# Patient Record
Sex: Female | Born: 1945 | Race: White | Hispanic: No | Marital: Single | Smoking: Never smoker
Health system: Southern US, Community
[De-identification: ages and names within clinical notes are randomized; demographics above are authoritative.]

## PROBLEM LIST (undated history)

## (undated) DIAGNOSIS — D649 Anemia, unspecified: Secondary | ICD-10-CM

## (undated) DIAGNOSIS — G43909 Migraine, unspecified, not intractable, without status migrainosus: Secondary | ICD-10-CM

## (undated) DIAGNOSIS — I10 Essential (primary) hypertension: Secondary | ICD-10-CM

## (undated) DIAGNOSIS — N179 Acute kidney failure, unspecified: Secondary | ICD-10-CM

## (undated) DIAGNOSIS — D472 Monoclonal gammopathy: Secondary | ICD-10-CM

## (undated) DIAGNOSIS — E785 Hyperlipidemia, unspecified: Secondary | ICD-10-CM

## (undated) DIAGNOSIS — J45909 Unspecified asthma, uncomplicated: Secondary | ICD-10-CM

## (undated) HISTORY — DX: Hyperlipidemia, unspecified: E78.5

## (undated) HISTORY — DX: Anemia, unspecified: D64.9

## (undated) HISTORY — DX: Monoclonal gammopathy: D47.2

## (undated) HISTORY — DX: Migraine, unspecified, not intractable, without status migrainosus: G43.909

## (undated) HISTORY — DX: Essential (primary) hypertension: I10

## (undated) HISTORY — PX: FOOT SURGERY: SHX648

---

## 2014-03-25 ENCOUNTER — Other Ambulatory Visit: Payer: Self-pay | Admitting: Family Medicine

## 2014-03-25 DIAGNOSIS — Z1231 Encounter for screening mammogram for malignant neoplasm of breast: Secondary | ICD-10-CM

## 2014-04-11 ENCOUNTER — Ambulatory Visit
Admission: RE | Admit: 2014-04-11 | Discharge: 2014-04-11 | Disposition: A | Discharge status: Home / Self Care | Payer: 59 | Source: Ambulatory Visit | Attending: Family Medicine | Admitting: Family Medicine

## 2014-04-11 DIAGNOSIS — Z1231 Encounter for screening mammogram for malignant neoplasm of breast: Secondary | ICD-10-CM

## 2014-11-22 ENCOUNTER — Ambulatory Visit (INDEPENDENT_AMBULATORY_CARE_PROVIDER_SITE_OTHER): Payer: 59

## 2014-11-22 ENCOUNTER — Encounter: Payer: Self-pay | Admitting: Podiatry

## 2014-11-22 ENCOUNTER — Ambulatory Visit (INDEPENDENT_AMBULATORY_CARE_PROVIDER_SITE_OTHER): Payer: 59 | Admitting: Podiatry

## 2014-11-22 VITALS — BP 120/66 | HR 75 | Resp 16 | Ht 68.0 in | Wt 170.0 lb

## 2014-11-22 DIAGNOSIS — M722 Plantar fascial fibromatosis: Secondary | ICD-10-CM

## 2014-11-22 NOTE — Patient Instructions (Signed)

## 2014-11-22 NOTE — Progress Notes (Signed)
   Subjective:    Patient ID: Rebecca Chen, female    DOB: August 12, 1946, 69 y.o.   MRN: [id number]  HPI Comments: Bilateral heel pain , sore heels. The real pain started about two weeks ago. I thought it was my orthotics. Ordered Rebecca Chen , new to [place] [employer] area. Right more so than the left .   Foot Pain      Review of Systems  All other systems reviewed and are negative.      Objective:   Physical Exam: I have reviewed her past medical history medications allergies surgery social history and review of systems. Pulses are strongly palpable bilateral. Neurologic sensorium is intact per Semmes-Weinstein monofilament. Deep tendon reflexes are intact bilateral strength +5 over 5 dorsiflexion plantar flexors and inverters everters onto the musculature is intact. Orthopedic evaluation of his roots all joints distal to the ankle for range of motion without crepitation. She has pain on palpation medial calcaneal tubercles bilateral right greater than left. Radiographic evaluation demonstrates soft tissue increase in density at the plantar fascial calcaneal insertion sites right greater than left. I evaluated her orthotics today which appear to be in good condition. Cutaneous evaluation demonstrates supple well-hydrated cutis no erythema edema cellulitis drainage or odor.        Assessment & Plan:  Assessment: Plantar fasciitis bilateral. Right greater than left.  Plan: Continue the use of the orthotics and see about purchasing a new pair of tennis shoes. We discussed appropriate shoe gear stretching exercises ice therapy issue or modifications. I injected her right heel today with Kenalog and local anesthetic. 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

## 2014-12-20 ENCOUNTER — Ambulatory Visit (INDEPENDENT_AMBULATORY_CARE_PROVIDER_SITE_OTHER): Payer: 59 | Admitting: Podiatry

## 2014-12-20 ENCOUNTER — Encounter: Payer: Self-pay | Admitting: Podiatry

## 2014-12-20 DIAGNOSIS — M722 Plantar fascial fibromatosis: Secondary | ICD-10-CM

## 2014-12-20 NOTE — Progress Notes (Signed)
She presents today for follow-up of her plantar fasciitis. She states that is slowly getting better after having been sore after the injection.  Objective: Vital signs are stable she is alert and oriented 3. Pulses are palpable right. Minimal pain on palpation medial calcaneal tubercle of the right heel.  Assessment: Resolving plantar fasciitis right.  Plan: Encouraged conservative therapies to be continued. We also dispensed a plantar fascial night brace.

## 2015-01-09 ENCOUNTER — Other Ambulatory Visit: Payer: Self-pay | Admitting: Family Medicine

## 2015-01-09 DIAGNOSIS — E2839 Other primary ovarian failure: Secondary | ICD-10-CM

## 2015-01-16 ENCOUNTER — Ambulatory Visit
Admission: RE | Admit: 2015-01-16 | Discharge: 2015-01-16 | Disposition: A | Discharge status: Home / Self Care | Payer: 59 | Source: Ambulatory Visit | Attending: Family Medicine | Admitting: Family Medicine

## 2015-01-16 DIAGNOSIS — E2839 Other primary ovarian failure: Secondary | ICD-10-CM

## 2015-01-26 ENCOUNTER — Ambulatory Visit: Payer: 59 | Admitting: Podiatry

## 2015-11-23 DIAGNOSIS — H43812 Vitreous degeneration, left eye: Secondary | ICD-10-CM | POA: Diagnosis not present

## 2015-12-06 MED FILL — AZITHROMYCIN 250 MG TABLET: 250 | 5 days supply | Qty: 6 | Fill #0

## 2015-12-25 MED FILL — SIMVASTATIN 40 MG TABLET: 40 | 90 days supply | Qty: 90 | Fill #1

## 2015-12-25 MED FILL — ATENOLOL 50 MG TABLET: 50 | 90 days supply | Qty: 90 | Fill #1

## 2016-01-04 DIAGNOSIS — Z85828 Personal history of other malignant neoplasm of skin: Secondary | ICD-10-CM | POA: Diagnosis not present

## 2016-01-04 DIAGNOSIS — L578 Other skin changes due to chronic exposure to nonionizing radiation: Secondary | ICD-10-CM | POA: Diagnosis not present

## 2016-01-04 DIAGNOSIS — L570 Actinic keratosis: Secondary | ICD-10-CM | POA: Diagnosis not present

## 2016-01-04 DIAGNOSIS — L814 Other melanin hyperpigmentation: Secondary | ICD-10-CM | POA: Diagnosis not present

## 2016-01-04 DIAGNOSIS — L821 Other seborrheic keratosis: Secondary | ICD-10-CM | POA: Diagnosis not present

## 2016-01-04 DIAGNOSIS — D225 Melanocytic nevi of trunk: Secondary | ICD-10-CM | POA: Diagnosis not present

## 2016-02-12 DIAGNOSIS — H52222 Regular astigmatism, left eye: Secondary | ICD-10-CM | POA: Diagnosis not present

## 2016-02-12 DIAGNOSIS — H524 Presbyopia: Secondary | ICD-10-CM | POA: Diagnosis not present

## 2016-02-12 DIAGNOSIS — H5203 Hypermetropia, bilateral: Secondary | ICD-10-CM | POA: Diagnosis not present

## 2016-03-19 MED FILL — SIMVASTATIN 40 MG TABLET: 40 | 90 days supply | Qty: 90 | Fill #0

## 2016-03-19 MED FILL — ATENOLOL 50 MG TABLET: 50 | 90 days supply | Qty: 90 | Fill #0

## 2016-04-22 DIAGNOSIS — E785 Hyperlipidemia, unspecified: Secondary | ICD-10-CM | POA: Diagnosis not present

## 2016-04-22 DIAGNOSIS — I10 Essential (primary) hypertension: Secondary | ICD-10-CM | POA: Diagnosis not present

## 2016-06-17 MED FILL — SIMVASTATIN 40 MG TABLET: 40 | 90 days supply | Qty: 90 | Fill #0

## 2016-06-17 MED FILL — ATENOLOL 50 MG TABLET: 50 | 30 days supply | Qty: 30 | Fill #0

## 2016-07-15 MED FILL — ATENOLOL 50 MG TABLET: 50 | 90 days supply | Qty: 90 | Fill #1

## 2016-07-16 DIAGNOSIS — M9901 Segmental and somatic dysfunction of cervical region: Secondary | ICD-10-CM | POA: Diagnosis not present

## 2016-07-16 DIAGNOSIS — M62838 Other muscle spasm: Secondary | ICD-10-CM | POA: Diagnosis not present

## 2016-07-16 DIAGNOSIS — M25512 Pain in left shoulder: Secondary | ICD-10-CM | POA: Diagnosis not present

## 2016-07-16 DIAGNOSIS — M9902 Segmental and somatic dysfunction of thoracic region: Secondary | ICD-10-CM | POA: Diagnosis not present

## 2016-07-19 DIAGNOSIS — M9901 Segmental and somatic dysfunction of cervical region: Secondary | ICD-10-CM | POA: Diagnosis not present

## 2016-07-19 DIAGNOSIS — M9902 Segmental and somatic dysfunction of thoracic region: Secondary | ICD-10-CM | POA: Diagnosis not present

## 2016-07-19 DIAGNOSIS — M25512 Pain in left shoulder: Secondary | ICD-10-CM | POA: Diagnosis not present

## 2016-07-19 DIAGNOSIS — M62838 Other muscle spasm: Secondary | ICD-10-CM | POA: Diagnosis not present

## 2016-07-24 DIAGNOSIS — M9901 Segmental and somatic dysfunction of cervical region: Secondary | ICD-10-CM | POA: Diagnosis not present

## 2016-07-24 DIAGNOSIS — M9902 Segmental and somatic dysfunction of thoracic region: Secondary | ICD-10-CM | POA: Diagnosis not present

## 2016-07-24 DIAGNOSIS — M25512 Pain in left shoulder: Secondary | ICD-10-CM | POA: Diagnosis not present

## 2016-07-24 DIAGNOSIS — M62838 Other muscle spasm: Secondary | ICD-10-CM | POA: Diagnosis not present

## 2016-07-29 DIAGNOSIS — M9902 Segmental and somatic dysfunction of thoracic region: Secondary | ICD-10-CM | POA: Diagnosis not present

## 2016-07-29 DIAGNOSIS — M9901 Segmental and somatic dysfunction of cervical region: Secondary | ICD-10-CM | POA: Diagnosis not present

## 2016-07-29 DIAGNOSIS — M62838 Other muscle spasm: Secondary | ICD-10-CM | POA: Diagnosis not present

## 2016-07-29 DIAGNOSIS — M25512 Pain in left shoulder: Secondary | ICD-10-CM | POA: Diagnosis not present

## 2016-08-01 DIAGNOSIS — M9902 Segmental and somatic dysfunction of thoracic region: Secondary | ICD-10-CM | POA: Diagnosis not present

## 2016-08-01 DIAGNOSIS — M25512 Pain in left shoulder: Secondary | ICD-10-CM | POA: Diagnosis not present

## 2016-08-01 DIAGNOSIS — M62838 Other muscle spasm: Secondary | ICD-10-CM | POA: Diagnosis not present

## 2016-08-01 DIAGNOSIS — M9901 Segmental and somatic dysfunction of cervical region: Secondary | ICD-10-CM | POA: Diagnosis not present

## 2016-08-07 DIAGNOSIS — M9902 Segmental and somatic dysfunction of thoracic region: Secondary | ICD-10-CM | POA: Diagnosis not present

## 2016-08-07 DIAGNOSIS — M62838 Other muscle spasm: Secondary | ICD-10-CM | POA: Diagnosis not present

## 2016-08-07 DIAGNOSIS — M9901 Segmental and somatic dysfunction of cervical region: Secondary | ICD-10-CM | POA: Diagnosis not present

## 2016-08-07 DIAGNOSIS — M25512 Pain in left shoulder: Secondary | ICD-10-CM | POA: Diagnosis not present

## 2016-09-09 MED FILL — SIMVASTATIN 40 MG TABLET: 40 | 90 days supply | Qty: 90 | Fill #1

## 2016-10-14 MED FILL — ATENOLOL 50 MG TABLET: 50 | 30 days supply | Qty: 30 | Fill #2

## 2016-11-18 MED FILL — ATENOLOL 50 MG TABLET: 50 | 30 days supply | Qty: 30 | Fill #3

## 2016-12-20 MED FILL — SIMVASTATIN 40 MG TABLET: 40 | 90 days supply | Qty: 90 | Fill #2

## 2016-12-20 MED FILL — ATENOLOL 50 MG TABLET: 50 | 30 days supply | Qty: 30 | Fill #4

## 2016-12-24 MED FILL — AZITHROMYCIN 250 MG TABLET: 250 | 5 days supply | Qty: 6 | Fill #0

## 2016-12-24 MED FILL — CIPROFLOXACIN HCL 500 MG TA: 500 | 3 days supply | Qty: 6 | Fill #0

## 2017-01-20 MED FILL — ATENOLOL 50 MG TABLET: 50 | 30 days supply | Qty: 30 | Fill #5

## 2017-02-17 MED FILL — ATENOLOL 50 MG TABLET: 50 | 30 days supply | Qty: 30 | Fill #6

## 2017-03-17 MED FILL — ATENOLOL 50 MG TABLET: 50 | 30 days supply | Qty: 30 | Fill #7

## 2017-04-04 DIAGNOSIS — E559 Vitamin D deficiency, unspecified: Secondary | ICD-10-CM | POA: Diagnosis not present

## 2017-04-04 DIAGNOSIS — Z0000 Encounter for general adult medical examination without abnormal findings: Secondary | ICD-10-CM | POA: Diagnosis not present

## 2017-04-04 DIAGNOSIS — I10 Essential (primary) hypertension: Secondary | ICD-10-CM | POA: Diagnosis not present

## 2017-04-04 DIAGNOSIS — E785 Hyperlipidemia, unspecified: Secondary | ICD-10-CM | POA: Diagnosis not present

## 2017-04-04 DIAGNOSIS — Z5181 Encounter for therapeutic drug level monitoring: Secondary | ICD-10-CM | POA: Diagnosis not present

## 2017-04-04 MED FILL — SIMVASTATIN 40 MG TABLET: 40 | 90 days supply | Qty: 90 | Fill #0

## 2017-04-04 MED FILL — EPIPEN 2-PAK 0.3 MG AUTO-IN: 0.3 | 30 days supply | Qty: 2 | Fill #0

## 2017-04-10 ENCOUNTER — Other Ambulatory Visit: Payer: Self-pay | Admitting: Family Medicine

## 2017-04-10 DIAGNOSIS — Z1231 Encounter for screening mammogram for malignant neoplasm of breast: Secondary | ICD-10-CM

## 2017-04-14 MED FILL — ATENOLOL 50 MG TABLET: 50 | 30 days supply | Qty: 30 | Fill #8

## 2017-04-21 DIAGNOSIS — D1801 Hemangioma of skin and subcutaneous tissue: Secondary | ICD-10-CM | POA: Diagnosis not present

## 2017-04-21 DIAGNOSIS — L814 Other melanin hyperpigmentation: Secondary | ICD-10-CM | POA: Diagnosis not present

## 2017-04-21 DIAGNOSIS — L570 Actinic keratosis: Secondary | ICD-10-CM | POA: Diagnosis not present

## 2017-04-21 DIAGNOSIS — L821 Other seborrheic keratosis: Secondary | ICD-10-CM | POA: Diagnosis not present

## 2017-04-21 DIAGNOSIS — L918 Other hypertrophic disorders of the skin: Secondary | ICD-10-CM | POA: Diagnosis not present

## 2017-04-21 DIAGNOSIS — D225 Melanocytic nevi of trunk: Secondary | ICD-10-CM | POA: Diagnosis not present

## 2017-04-25 ENCOUNTER — Ambulatory Visit
Admission: RE | Admit: 2017-04-25 | Discharge: 2017-04-25 | Disposition: A | Discharge status: Home / Self Care | Payer: 59 | Source: Ambulatory Visit | Attending: Family Medicine | Admitting: Family Medicine

## 2017-04-25 DIAGNOSIS — Z1231 Encounter for screening mammogram for malignant neoplasm of breast: Secondary | ICD-10-CM | POA: Diagnosis not present

## 2017-04-30 DIAGNOSIS — H5203 Hypermetropia, bilateral: Secondary | ICD-10-CM | POA: Diagnosis not present

## 2017-04-30 DIAGNOSIS — H524 Presbyopia: Secondary | ICD-10-CM | POA: Diagnosis not present

## 2017-04-30 DIAGNOSIS — H52222 Regular astigmatism, left eye: Secondary | ICD-10-CM | POA: Diagnosis not present

## 2017-05-19 MED FILL — ATENOLOL 50 MG TABLET: 50 | 90 days supply | Qty: 90 | Fill #0

## 2017-05-30 MED FILL — AZITHROMYCIN 250 MG TABLET: 250 | 5 days supply | Qty: 6 | Fill #0

## 2017-05-30 MED FILL — VIVOTIF EC CAPSULE: 8 days supply | Qty: 4 | Fill #0

## 2017-08-18 MED FILL — ATENOLOL 50 MG TABLET: 50 | 90 days supply | Qty: 90 | Fill #1

## 2017-08-18 MED FILL — SIMVASTATIN 40 MG TABLET: 40 | 90 days supply | Qty: 90 | Fill #1

## 2017-10-02 DIAGNOSIS — M150 Primary generalized (osteo)arthritis: Secondary | ICD-10-CM | POA: Diagnosis not present

## 2017-10-02 DIAGNOSIS — E663 Overweight: Secondary | ICD-10-CM | POA: Diagnosis not present

## 2017-10-02 DIAGNOSIS — Z6827 Body mass index (BMI) 27.0-27.9, adult: Secondary | ICD-10-CM | POA: Diagnosis not present

## 2017-10-02 DIAGNOSIS — L84 Corns and callosities: Secondary | ICD-10-CM | POA: Diagnosis not present

## 2017-10-02 DIAGNOSIS — M2550 Pain in unspecified joint: Secondary | ICD-10-CM | POA: Diagnosis not present

## 2017-11-17 MED FILL — ATENOLOL 50 MG TABLET: 50 | 90 days supply | Qty: 90 | Fill #2

## 2017-11-17 MED FILL — SIMVASTATIN 40 MG TABLET: 40 | 90 days supply | Qty: 90 | Fill #2

## 2018-02-16 MED FILL — SIMVASTATIN 40 MG TABLET: 40 | 90 days supply | Qty: 90 | Fill #3

## 2018-02-16 MED FILL — ATENOLOL 50 MG TABLET: 50 | 90 days supply | Qty: 90 | Fill #3

## 2018-04-29 DIAGNOSIS — L821 Other seborrheic keratosis: Secondary | ICD-10-CM | POA: Diagnosis not present

## 2018-04-29 DIAGNOSIS — D1801 Hemangioma of skin and subcutaneous tissue: Secondary | ICD-10-CM | POA: Diagnosis not present

## 2018-04-29 DIAGNOSIS — L814 Other melanin hyperpigmentation: Secondary | ICD-10-CM | POA: Diagnosis not present

## 2018-04-29 DIAGNOSIS — D225 Melanocytic nevi of trunk: Secondary | ICD-10-CM | POA: Diagnosis not present

## 2018-05-18 MED FILL — SIMVASTATIN 40 MG TABLET: 40 | 90 days supply | Qty: 90 | Fill #0

## 2018-05-18 MED FILL — ATENOLOL 50 MG TABLET: 50 | 90 days supply | Qty: 90 | Fill #0

## 2018-05-29 DIAGNOSIS — Z0000 Encounter for general adult medical examination without abnormal findings: Secondary | ICD-10-CM | POA: Diagnosis not present

## 2018-05-29 DIAGNOSIS — Z5181 Encounter for therapeutic drug level monitoring: Secondary | ICD-10-CM | POA: Diagnosis not present

## 2018-05-29 DIAGNOSIS — E559 Vitamin D deficiency, unspecified: Secondary | ICD-10-CM | POA: Diagnosis not present

## 2018-05-29 DIAGNOSIS — R05 Cough: Secondary | ICD-10-CM | POA: Diagnosis not present

## 2018-05-29 DIAGNOSIS — E785 Hyperlipidemia, unspecified: Secondary | ICD-10-CM | POA: Diagnosis not present

## 2018-05-29 DIAGNOSIS — I10 Essential (primary) hypertension: Secondary | ICD-10-CM | POA: Diagnosis not present

## 2018-06-02 ENCOUNTER — Other Ambulatory Visit: Payer: Self-pay | Admitting: Family Medicine

## 2018-06-02 DIAGNOSIS — Z1231 Encounter for screening mammogram for malignant neoplasm of breast: Secondary | ICD-10-CM

## 2018-06-02 MED FILL — AZITHROMYCIN 250 MG TABLET: 250 | 5 days supply | Qty: 6 | Fill #0

## 2018-06-02 MED FILL — PROMETHAZINE W/COD SYRUP: 6.25-10 | 5 days supply | Qty: 100 | Fill #0

## 2018-06-29 ENCOUNTER — Ambulatory Visit
Admission: RE | Admit: 2018-06-29 | Discharge: 2018-06-29 | Disposition: A | Discharge status: Home / Self Care | Payer: 59 | Source: Ambulatory Visit | Attending: Family Medicine | Admitting: Family Medicine

## 2018-06-29 DIAGNOSIS — Z1231 Encounter for screening mammogram for malignant neoplasm of breast: Secondary | ICD-10-CM

## 2018-07-14 MED FILL — PEG-3350 SOLUTION: 420 | 1 days supply | Qty: 4000 | Fill #0

## 2018-07-15 DIAGNOSIS — L570 Actinic keratosis: Secondary | ICD-10-CM | POA: Diagnosis not present

## 2018-07-15 DIAGNOSIS — L720 Epidermal cyst: Secondary | ICD-10-CM | POA: Diagnosis not present

## 2018-08-10 MED FILL — ATENOLOL 50 MG TABLET: 50 | 90 days supply | Qty: 90 | Fill #0

## 2018-08-14 MED FILL — SIMVASTATIN 40 MG TABLET: 40 | 90 days supply | Qty: 90 | Fill #0

## 2018-08-26 DIAGNOSIS — K649 Unspecified hemorrhoids: Secondary | ICD-10-CM | POA: Diagnosis not present

## 2018-08-26 DIAGNOSIS — Z86010 Personal history of colonic polyps: Secondary | ICD-10-CM | POA: Diagnosis not present

## 2018-08-26 DIAGNOSIS — K5730 Diverticulosis of large intestine without perforation or abscess without bleeding: Secondary | ICD-10-CM | POA: Diagnosis not present

## 2018-09-14 MED FILL — SHINGRIX 50 MCG SUS: 50 | 1 days supply | Qty: 1 | Fill #1

## 2018-10-02 ENCOUNTER — Emergency Department (HOSPITAL_COMMUNITY): Payer: Medicare Other

## 2018-10-02 ENCOUNTER — Encounter (HOSPITAL_COMMUNITY): Payer: Self-pay | Admitting: *Deleted

## 2018-10-02 ENCOUNTER — Emergency Department (HOSPITAL_COMMUNITY)
Admission: EM | Admit: 2018-10-02 | Discharge: 2018-10-02 | Disposition: A | Discharge status: Home / Self Care | Payer: Medicare Other | Attending: Emergency Medicine | Admitting: Emergency Medicine

## 2018-10-02 DIAGNOSIS — I10 Essential (primary) hypertension: Secondary | ICD-10-CM | POA: Insufficient documentation

## 2018-10-02 DIAGNOSIS — R0789 Other chest pain: Secondary | ICD-10-CM | POA: Insufficient documentation

## 2018-10-02 DIAGNOSIS — R05 Cough: Secondary | ICD-10-CM | POA: Diagnosis not present

## 2018-10-02 DIAGNOSIS — R079 Chest pain, unspecified: Secondary | ICD-10-CM | POA: Diagnosis not present

## 2018-10-02 DIAGNOSIS — Z79899 Other long term (current) drug therapy: Secondary | ICD-10-CM | POA: Insufficient documentation

## 2018-10-02 DIAGNOSIS — R0602 Shortness of breath: Secondary | ICD-10-CM | POA: Diagnosis not present

## 2018-10-02 LAB — BASIC METABOLIC PANEL
Anion gap: 7 (ref 5–15)
BUN: 15 mg/dL (ref 8–23)
CALCIUM: 9.4 mg/dL (ref 8.9–10.3)
CO2: 25 mmol/L (ref 22–32)
CREATININE: 1.31 mg/dL — AB (ref 0.44–1.00)
Chloride: 107 mmol/L (ref 98–111)
GFR calc Af Amer: 48 mL/min — ABNORMAL LOW (ref >60)
GFR, EST NON AFRICAN AMERICAN: 41 mL/min — AB (ref >60)
Glucose, Bld: 102 mg/dL — ABNORMAL HIGH (ref 70–99)
Potassium: 4.4 mmol/L (ref 3.5–5.1)
SODIUM: 139 mmol/L (ref 135–145)

## 2018-10-02 LAB — I-STAT TROPONIN, ED
TROPONIN I, POC: 0 ng/mL (ref 0.00–0.08)
Troponin i, poc: 0.01 ng/mL (ref 0.00–0.08)

## 2018-10-02 LAB — CBC
HCT: 33.6 % — ABNORMAL LOW (ref 36.0–46.0)
Hemoglobin: 10.4 g/dL — ABNORMAL LOW (ref 12.0–15.0)
MCH: 27.9 pg (ref 26.0–34.0)
MCHC: 31 g/dL (ref 30.0–36.0)
MCV: 90.1 fL (ref 80.0–100.0)
PLATELETS: 358 10*3/uL (ref 150–400)
RBC: 3.73 MIL/uL — ABNORMAL LOW (ref 3.87–5.11)
RDW: 14.7 % (ref 11.5–15.5)
WBC: 7.4 10*3/uL (ref 4.0–10.5)
nRBC: 0 % (ref 0.0–0.2)

## 2018-10-02 MED ORDER — ALBUTEROL SULFATE HFA 108 (90 BASE) MCG/ACT IN AERS
2.0000 | INHALATION_SPRAY | Freq: Once | RESPIRATORY_TRACT | Status: AC
  Administered 2018-10-02: 2 via RESPIRATORY_TRACT
  Filled 2018-10-02: qty 6.7

## 2018-10-02 NOTE — Discharge Instructions (Signed)
If you develop severe or constant chest pain or pressure, worsening shortness of breath, fever, vomiting, or any other new/concerning symptoms then return to the ER for evaluation.

## 2018-10-02 NOTE — ED Triage Notes (Signed)
Pt in c/o chest pain, shortness of breath with exertion, dry cough for the last few days, worse today

## 2018-10-02 NOTE — ED Notes (Signed)
Called for updating vitals no answer x3

## 2018-10-02 NOTE — ED Provider Notes (Signed)
[facility] EMERGENCY DEPARTMENT Provider Note   CSN: [id number] Arrival date & time: 10/02/18  1153     History   Chief Complaint Chief Complaint  Patient presents with  . Chest Pain    HPI Rebecca Chen is a 73 y.o. female.  HPI  73 year old female presents with chest pressure and dyspnea.  Has been ongoing for the past 2 days.  Shortness of breath is most noticeable when she walks or exerts herself.  The chest pressure comes and goes randomly and lasts less than a minute at a time.  She has been having a cough, most noticeable over the last few days as well but also a little bit before then.  No fevers or productive cough.  Chronic lower extremity swelling but this is unchanged from normal.  She does not have any of the chest pressure or pain right now.  She has felt like she has had an audible expiratory wheeze from time to time.  No smoking history.  Past Medical History:  Diagnosis Date  . Hypertension     There are no active problems to display for this patient.   Past Surgical History:  Procedure Laterality Date  . FOOT SURGERY       OB History   No obstetric history on file.      Home Medications    Prior to Admission medications   Medication Sig Start Date End Date Taking? Authorizing Provider  atenolol (TENORMIN) 50 MG tablet Take 50 mg by mouth daily.    [provider]  simvastatin (ZOCOR) 40 MG tablet Take 40 mg by mouth daily.    [provider]    Family History History reviewed. No pertinent family history.  Social History Social History   Tobacco Use  . Smoking status: Never Smoker  Substance Use Topics  . Alcohol use: Not on file  . Drug use: Not on file     Allergies   Patient has no known allergies.   Review of Systems Review of Systems  Constitutional: Negative for fever.  Respiratory: Positive for cough and shortness of breath.   Cardiovascular: Positive for chest pain and leg swelling  (chronic ankle swelling).  All other systems reviewed and are negative.    Physical Exam Updated Vital Signs BP 137/80 (BP Location: Right Arm)   Pulse 70   Temp 98.1 F (36.7 C) (Oral)   Resp 16   SpO2 98%   Physical Exam Vitals signs and nursing note reviewed.  Constitutional:      General: She is not in acute distress.    Appearance: She is well-developed. She is not ill-appearing or diaphoretic.  HENT:     Head: Normocephalic and atraumatic.     Right Ear: External ear normal.     Left Ear: External ear normal.     Nose: Nose normal.  Eyes:     General:        Right eye: No discharge.        Left eye: No discharge.  Cardiovascular:     Rate and Rhythm: Normal rate and regular rhythm.     Heart sounds: Normal heart sounds.  Pulmonary:     Effort: Pulmonary effort is normal.     Breath sounds: Normal breath sounds. No decreased breath sounds, wheezing, rhonchi or rales.  Abdominal:     Palpations: Abdomen is soft.     Tenderness: There is no abdominal tenderness.  Skin:    General: Skin  is warm and dry.  Neurological:     Mental Status: She is alert.  Psychiatric:        Mood and Affect: Mood is not anxious.      ED Treatments / Results  Labs (all labs ordered are listed, but only abnormal results are displayed) Labs Reviewed  BASIC METABOLIC PANEL - Abnormal; Notable for the following components:      Result Value   Glucose, Bld 102 (*)    Creatinine, Ser 1.31 (*)    GFR calc non Af Amer 41 (*)    GFR calc Af Amer 48 (*)    All other components within normal limits  CBC - Abnormal; Notable for the following components:   RBC 3.73 (*)    Hemoglobin 10.4 (*)    HCT 33.6 (*)    All other components within normal limits  I-STAT TROPONIN, ED  I-STAT TROPONIN, ED    EKG EKG Interpretation  Date/Time:  Friday October 02 2018 12:16:57 EST Ventricular Rate:  66 PR Interval:  168 QRS Duration: 74 QT Interval:  394 QTC Calculation: 413 R  Axis:   7 Text Interpretation:  Normal sinus rhythm no acute ST/T changes No old tracing to compare Confirmed by Sherwood Gambler [phone]) on 10/02/2018 7:09:16 PM   EKG Interpretation  Date/Time:  Friday October 02 2018 19:45:02 EST Ventricular Rate:  71 PR Interval:  168 QRS Duration: 86 QT Interval:  396 QTC Calculation: 431 R Axis:   8 Text Interpretation:  Sinus rhythm Abnormal R-wave progression, early transition Baseline wander in lead(s) V2 no significant change since earlier in the day Confirmed by Sherwood Gambler [phone]) on 10/02/2018 8:00:39 PM       Radiology Dg Chest 2 View  Result Date: 10/02/2018 CLINICAL DATA:  Chest pain, shortness of breath EXAM: CHEST - 2 VIEW COMPARISON:  None. FINDINGS: The heart size and mediastinal contours are within normal limits. Both lungs are clear. The visualized skeletal structures are unremarkable. Trachea is midline. Aorta atherosclerotic. Subcentimeter right mid lung calcified granuloma. Degenerative changes noted spine. IMPRESSION: No active chest disease. Remote granulomatous disease. Aortic atherosclerosis Electronically Signed   By: Jerilynn Mages.  Shick M.D.   On: 10/02/2018 12:50    Procedures Procedures (including critical care time)  Medications Ordered in ED Medications  albuterol (PROVENTIL HFA;VENTOLIN HFA) 108 (90 Base) MCG/ACT inhaler 2 puff (has no administration in time range)     Initial Impression / Assessment and Plan / ED Course  I have reviewed the triage vital signs and the nursing notes.  Pertinent labs & imaging results that were available during my care of the patient were reviewed by me and considered in my medical decision making (see chart for details).     Patient's chest pain is atypical.  In the setting of cough/recent URI my suspicion of ACS, PE, or dissection is pretty low.  She is not having any chest pain currently.  She has no hypoxia, increased work of breathing, or tachycardia.  With no specific PE risk factors  in the setting I think this is pretty unlikely and do not think testing is needed.  She has troponins negative x2 and no acute ECG changes.  I think it is reasonable to try and treat her on and off wheezing with some albuterol as needed at home and otherwise follow-up with PCP.  We discussed that MI has been ruled out but not any coronary disease at all and she still needs to follow-up with PCP and  we also discussed strict return precautions.  Final Clinical Impressions(s) / ED Diagnoses   Final diagnoses:  Chest pressure    ED Discharge Orders    None       Sherwood Gambler, MD 10/02/18 2014

## 2018-10-02 NOTE — ED Notes (Signed)
Patient verbalizes understanding of discharge instructions. Opportunity for questioning and answers were provided. Armband removed by staff, pt discharged from ED.  

## 2018-10-05 DIAGNOSIS — J209 Acute bronchitis, unspecified: Secondary | ICD-10-CM | POA: Diagnosis not present

## 2018-12-15 DIAGNOSIS — G2581 Restless legs syndrome: Secondary | ICD-10-CM | POA: Diagnosis not present

## 2018-12-15 DIAGNOSIS — J209 Acute bronchitis, unspecified: Secondary | ICD-10-CM | POA: Diagnosis not present

## 2018-12-15 DIAGNOSIS — G43909 Migraine, unspecified, not intractable, without status migrainosus: Secondary | ICD-10-CM | POA: Diagnosis not present

## 2018-12-15 DIAGNOSIS — R079 Chest pain, unspecified: Secondary | ICD-10-CM | POA: Diagnosis not present

## 2018-12-23 DIAGNOSIS — R0789 Other chest pain: Secondary | ICD-10-CM | POA: Diagnosis not present

## 2018-12-23 DIAGNOSIS — J209 Acute bronchitis, unspecified: Secondary | ICD-10-CM | POA: Diagnosis not present

## 2018-12-24 ENCOUNTER — Telehealth: Payer: Self-pay

## 2018-12-25 ENCOUNTER — Ambulatory Visit: Payer: Self-pay | Admitting: Cardiology

## 2018-12-30 ENCOUNTER — Emergency Department (HOSPITAL_COMMUNITY): Payer: Medicare Other

## 2018-12-30 ENCOUNTER — Encounter (HOSPITAL_COMMUNITY): Payer: Self-pay

## 2018-12-30 ENCOUNTER — Other Ambulatory Visit: Payer: Self-pay

## 2018-12-30 ENCOUNTER — Inpatient Hospital Stay (HOSPITAL_COMMUNITY)
Admission: EM | Admit: 2018-12-30 | Discharge: 2019-01-01 | DRG: 253 | Disposition: A | Discharge status: Home / Self Care | Payer: Medicare Other | Attending: Family Medicine | Admitting: Family Medicine

## 2018-12-30 DIAGNOSIS — I82401 Acute embolism and thrombosis of unspecified deep veins of right lower extremity: Secondary | ICD-10-CM | POA: Diagnosis not present

## 2018-12-30 DIAGNOSIS — T380X5A Adverse effect of glucocorticoids and synthetic analogues, initial encounter: Secondary | ICD-10-CM | POA: Diagnosis present

## 2018-12-30 DIAGNOSIS — Z7952 Long term (current) use of systemic steroids: Secondary | ICD-10-CM

## 2018-12-30 DIAGNOSIS — R609 Edema, unspecified: Secondary | ICD-10-CM | POA: Diagnosis not present

## 2018-12-30 DIAGNOSIS — K5790 Diverticulosis of intestine, part unspecified, without perforation or abscess without bleeding: Secondary | ICD-10-CM | POA: Diagnosis present

## 2018-12-30 DIAGNOSIS — Z8711 Personal history of peptic ulcer disease: Secondary | ICD-10-CM | POA: Diagnosis not present

## 2018-12-30 DIAGNOSIS — I82441 Acute embolism and thrombosis of right tibial vein: Principal | ICD-10-CM | POA: Diagnosis present

## 2018-12-30 DIAGNOSIS — M7989 Other specified soft tissue disorders: Secondary | ICD-10-CM | POA: Diagnosis not present

## 2018-12-30 DIAGNOSIS — R0602 Shortness of breath: Secondary | ICD-10-CM | POA: Diagnosis not present

## 2018-12-30 DIAGNOSIS — R739 Hyperglycemia, unspecified: Secondary | ICD-10-CM | POA: Diagnosis present

## 2018-12-30 DIAGNOSIS — N179 Acute kidney failure, unspecified: Secondary | ICD-10-CM | POA: Diagnosis not present

## 2018-12-30 DIAGNOSIS — R0600 Dyspnea, unspecified: Secondary | ICD-10-CM

## 2018-12-30 DIAGNOSIS — N183 Chronic kidney disease, stage 3 (moderate): Secondary | ICD-10-CM | POA: Diagnosis present

## 2018-12-30 DIAGNOSIS — D649 Anemia, unspecified: Secondary | ICD-10-CM

## 2018-12-30 DIAGNOSIS — K649 Unspecified hemorrhoids: Secondary | ICD-10-CM | POA: Diagnosis present

## 2018-12-30 DIAGNOSIS — I129 Hypertensive chronic kidney disease with stage 1 through stage 4 chronic kidney disease, or unspecified chronic kidney disease: Secondary | ICD-10-CM | POA: Diagnosis present

## 2018-12-30 DIAGNOSIS — D72829 Elevated white blood cell count, unspecified: Secondary | ICD-10-CM | POA: Diagnosis present

## 2018-12-30 DIAGNOSIS — D509 Iron deficiency anemia, unspecified: Secondary | ICD-10-CM | POA: Diagnosis not present

## 2018-12-30 DIAGNOSIS — Z79899 Other long term (current) drug therapy: Secondary | ICD-10-CM

## 2018-12-30 DIAGNOSIS — I82409 Acute embolism and thrombosis of unspecified deep veins of unspecified lower extremity: Secondary | ICD-10-CM

## 2018-12-30 HISTORY — DX: Acute kidney failure, unspecified: N17.9

## 2018-12-30 LAB — CBC WITH DIFFERENTIAL/PLATELET
Abs Immature Granulocytes: 0.19 10*3/uL — ABNORMAL HIGH (ref 0.00–0.07)
Basophils Absolute: 0 10*3/uL (ref 0.0–0.1)
Basophils Relative: 0 %
Eosinophils Absolute: 0.1 10*3/uL (ref 0.0–0.5)
Eosinophils Relative: 0 %
HCT: 18.4 % — ABNORMAL LOW (ref 36.0–46.0)
Hemoglobin: 5.1 g/dL — CL (ref 12.0–15.0)
Immature Granulocytes: 1 %
Lymphocytes Relative: 8 %
Lymphs Abs: 1.8 10*3/uL (ref 0.7–4.0)
MCH: 22.9 pg — ABNORMAL LOW (ref 26.0–34.0)
MCHC: 27.7 g/dL — ABNORMAL LOW (ref 30.0–36.0)
MCV: 82.5 fL (ref 80.0–100.0)
Monocytes Absolute: 1 10*3/uL (ref 0.1–1.0)
Monocytes Relative: 5 %
Neutro Abs: 18.4 10*3/uL — ABNORMAL HIGH (ref 1.7–7.7)
Neutrophils Relative %: 86 %
Platelets: 378 10*3/uL (ref 150–400)
RBC: 2.23 MIL/uL — ABNORMAL LOW (ref 3.87–5.11)
RDW: 17 % — ABNORMAL HIGH (ref 11.5–15.5)
WBC: 21.5 10*3/uL — ABNORMAL HIGH (ref 4.0–10.5)
nRBC: 0.1 % (ref 0.0–0.2)

## 2018-12-30 LAB — RETICULOCYTES
Immature Retic Fract: 36.4 % — ABNORMAL HIGH (ref 2.3–15.9)
RBC.: 2.24 MIL/uL — ABNORMAL LOW (ref 3.87–5.11)
Retic Count, Absolute: 78 10*3/uL (ref 19.0–186.0)
Retic Ct Pct: 3.5 % — ABNORMAL HIGH (ref 0.4–3.1)

## 2018-12-30 LAB — BASIC METABOLIC PANEL
Anion gap: 13 (ref 5–15)
BUN: 22 mg/dL (ref 8–23)
CO2: 21 mmol/L — ABNORMAL LOW (ref 22–32)
Calcium: 8.6 mg/dL — ABNORMAL LOW (ref 8.9–10.3)
Chloride: 103 mmol/L (ref 98–111)
Creatinine, Ser: 1.47 mg/dL — ABNORMAL HIGH (ref 0.44–1.00)
GFR calc Af Amer: 41 mL/min — ABNORMAL LOW (ref >60)
GFR calc non Af Amer: 36 mL/min — ABNORMAL LOW (ref >60)
Glucose, Bld: 179 mg/dL — ABNORMAL HIGH (ref 70–99)
Potassium: 3.7 mmol/L (ref 3.5–5.1)
Sodium: 137 mmol/L (ref 135–145)

## 2018-12-30 LAB — FERRITIN: Ferritin: 4 ng/mL — ABNORMAL LOW (ref 11–307)

## 2018-12-30 LAB — IRON AND TIBC
Iron: 9 ug/dL — ABNORMAL LOW (ref 28–170)
Saturation Ratios: 2 % — ABNORMAL LOW (ref 10.4–31.8)
TIBC: 502 ug/dL — ABNORMAL HIGH (ref 250–450)
UIBC: 493 ug/dL

## 2018-12-30 LAB — SAVE SMEAR (SSMR)

## 2018-12-30 LAB — PREPARE RBC (CROSSMATCH)

## 2018-12-30 MED ORDER — ONDANSETRON HCL 4 MG/2ML IJ SOLN: 4.0000 mg | Freq: Four times a day (QID) | INTRAMUSCULAR | Status: DC | PRN

## 2018-12-30 MED ORDER — HEPARIN SODIUM (PORCINE) 5000 UNIT/ML IJ SOLN
5000.0000 [IU] | Freq: Three times a day (TID) | INTRAMUSCULAR | Status: DC
  Administered 2018-12-31 – 2019-01-01 (×3): 5000 [IU] via SUBCUTANEOUS
  Filled 2018-12-30 (×3): qty 1

## 2018-12-30 MED ORDER — VITAMIN C 500 MG PO TABS
500.0000 mg | ORAL_TABLET | Freq: Every day | ORAL | Status: DC
  Administered 2018-12-31: 500 mg via ORAL
  Filled 2018-12-30: qty 1

## 2018-12-30 MED ORDER — HEPARIN SODIUM (PORCINE) 5000 UNIT/ML IJ SOLN: 5000.0000 [IU] | Freq: Three times a day (TID) | INTRAMUSCULAR | Status: DC

## 2018-12-30 MED ORDER — CYANOCOBALAMIN 500 MCG PO TABS
250.0000 ug | ORAL_TABLET | Freq: Every day | ORAL | Status: DC
  Administered 2018-12-31: 250 ug via ORAL
  Filled 2018-12-30 (×2): qty 1

## 2018-12-30 MED ORDER — SIMVASTATIN 40 MG PO TABS
40.0000 mg | ORAL_TABLET | Freq: Every day | ORAL | Status: DC
  Administered 2018-12-31: 10:00:00 40 mg via ORAL
  Filled 2018-12-30: qty 1

## 2018-12-30 MED ORDER — SODIUM CHLORIDE 0.9% IV SOLUTION
Freq: Once | INTRAVENOUS | Status: AC
  Administered 2018-12-30: 16:00:00 via INTRAVENOUS

## 2018-12-30 MED ORDER — ATENOLOL 50 MG PO TABS
50.0000 mg | ORAL_TABLET | Freq: Every day | ORAL | Status: DC
  Administered 2018-12-31: 50 mg via ORAL
  Filled 2018-12-30: qty 1

## 2018-12-30 MED ORDER — ACETAMINOPHEN 325 MG PO TABS: 650.0000 mg | ORAL_TABLET | Freq: Four times a day (QID) | ORAL | Status: DC | PRN

## 2018-12-30 NOTE — ED Notes (Signed)
Report called to Mandy RN.

## 2018-12-30 NOTE — ED Triage Notes (Signed)
Pt c/o SOB x 3 months. Seen in Jan. and Dx with bronchitis, initially given inhaler, following month seen again and given Levaquin, seen again end of February and given Z-pack and steroids. Continuing SOB. Followed up with telemed a week ago and this morning. Increasing SOB Pt came to ED.

## 2018-12-30 NOTE — ED Provider Notes (Signed)
[facility] DEPT Provider Note   CSN: [id number] Arrival date & time: 12/30/18  1227    History   Chief Complaint Chief Complaint  Patient presents with  . Shortness of Breath    HPI Rebecca Chen is a 73 y.o. female.     HPI Patient presents with shortness of breath.  Over the last 3 months she has had cough with some shortness of breath.  Has had some wheezing.  Has been seen in the ER and diagnosed with bronchitis given inhaler.  Also seen by PCP and has been on Levaquin and Z-Pak and on steroids twice.  At times the steroids that seem to help but really nothing is helped with the cough.  Shortness of breath worse over the last few days.  States she almost passed out in the shower when the hot air hit her.  No fevers.  Has some chronic swelling in her legs but unchanged.  No real chest pain.  States she is been seen by cardiology had enzymes -2 different times.  Had an x-ray done 3 months ago but none since.  She does not smoke.  No history of lung problems before these episodes.  No fevers. Past Medical History:  Diagnosis Date  . Hypertension     There are no active problems to display for this patient.   Past Surgical History:  Procedure Laterality Date  . FOOT SURGERY       OB History   No obstetric history on file.      Home Medications    Prior to Admission medications   Medication Sig Start Date End Date Taking? Authorizing Provider  atenolol (TENORMIN) 50 MG tablet Take 50 mg by mouth daily.    [provider]  predniSONE (DELTASONE) 20 MG tablet  12/23/18   [provider]  simvastatin (ZOCOR) 40 MG tablet Take 40 mg by mouth daily.    [provider]  zolpidem (AMBIEN) 10 MG tablet Take 10 mg by mouth at bedtime. 12/15/18   [provider]    Family History History reviewed. No pertinent family history.  Social History Social History   Tobacco Use  . Smoking status: Never Smoker   . Smokeless tobacco: Never Used  Substance Use Topics  . Alcohol use: Not on file  . Drug use: Not on file     Allergies   Patient has no known allergies.   Review of Systems Review of Systems  Constitutional: Negative for activity change and fever.  HENT: Negative for congestion.   Respiratory: Positive for cough and shortness of breath.   Cardiovascular: Positive for leg swelling. Negative for chest pain.  Gastrointestinal: Negative for abdominal pain.  Genitourinary: Negative for flank pain.  Musculoskeletal: Negative for back pain.  Skin: Negative for rash.  Neurological: Negative for weakness.  Psychiatric/Behavioral: Negative for confusion.     Physical Exam Updated Vital Signs BP (!) 126/59   Pulse 78   Temp 97.7 F (36.5 C) (Oral)   Resp 16   Ht 5\' 7"  (1.702 m)   Wt 81.6 kg   SpO2 100%   BMI 28.19 kg/m   Physical Exam Vitals signs and nursing note reviewed.  HENT:     Head: Normocephalic.  Neck:     Musculoskeletal: Neck supple.  Cardiovascular:     Rate and Rhythm: Normal rate and regular rhythm.  Pulmonary:     Breath sounds: Normal breath sounds. No decreased breath sounds, wheezing or rhonchi.  Chest:     Chest wall: No tenderness.  Musculoskeletal:     Right lower leg: She exhibits tenderness.     Left lower leg: She exhibits no tenderness.     Comments: Some no tenderness right medial lower leg.  Skin:    Capillary Refill: Capillary refill takes less than 2 seconds.  Neurological:     Mental Status: She is alert and oriented to person, place, and time.      ED Treatments / Results  Labs (all labs ordered are listed, but only abnormal results are displayed) Labs Reviewed  CBC WITH DIFFERENTIAL/PLATELET - Abnormal; Notable for the following components:      Result Value   WBC 21.5 (*)    RBC 2.23 (*)    Hemoglobin 5.1 (*)    HCT 18.4 (*)    MCH 22.9 (*)    MCHC 27.7 (*)    RDW 17.0 (*)    Neutro Abs 18.4 (*)    Abs Immature  Granulocytes 0.19 (*)    All other components within normal limits  BASIC METABOLIC PANEL - Abnormal; Notable for the following components:   CO2 21 (*)    Glucose, Bld 179 (*)    Creatinine, Ser 1.47 (*)    Calcium 8.6 (*)    GFR calc non Af Amer 36 (*)    GFR calc Af Amer 41 (*)    All other components within normal limits  POC OCCULT BLOOD, ED    EKG EKG Interpretation  Date/Time:  Wednesday December 30 2018 12:43:51 EDT Ventricular Rate:  77 PR Interval:    QRS Duration: 87 QT Interval:  385 QTC Calculation: 436 R Axis:   -3 Text Interpretation:  Sinus rhythm Abnormal R-wave progression, early transition Confirmed by Davonna Belling [phone]) on 12/30/2018 12:49:49 PM   Radiology Dg Chest 2 View  Result Date: 12/30/2018 CLINICAL DATA:  Pt c/o SOB x 3 months. Seen in Jan. and Dx with bronchitis, initially given inhaler, following month seen again and given Levaquin, seen again end of February and given Z-pack and steroids. Continuing SOB. Followed up with telemed a week ago and this morning. Increasing SOB Pt came to ED. EXAM: CHEST - 2 VIEW COMPARISON:  10/02/2018 FINDINGS: Cardiac silhouette is normal in size. Small hiatal hernia. No mediastinal or hilar masses or evidence of adenopathy. Stable right middle lobe granuloma. Lungs otherwise clear. No pleural effusion or pneumothorax. Skeletal structures are intact. IMPRESSION: No active cardiopulmonary disease. Electronically Signed   By: Lajean Manes M.D.   On: 12/30/2018 13:23    Procedures Procedures (including critical care time)  Medications Ordered in ED Medications - No data to display   Initial Impression / Assessment and Plan / ED Course  I have reviewed the triage vital signs and the nursing notes.  Pertinent labs & imaging results that were available during my care of the patient were reviewed by me and considered in my medical decision making (see chart for details).        Patient with shortness of breath  and cough.  Has had over the last 3 months.  Initial hemoglobin 3 months ago was 10 but now down to 5.  Was hypoxic with ambulation.  Guaiac negative.  Normocytic.  No history of anemia but states she had an ulcer years ago.  With symptomatic anemia I feel patient would benefit from [place] to the hospital.  Will discuss with hospitalist  Final Clinical Impressions(s) / ED Diagnoses   Final  diagnoses:  Dyspnea, unspecified type  Anemia, unspecified type    ED Discharge Orders    None       Davonna Belling, MD 12/30/18 1424

## 2018-12-30 NOTE — H&P (Signed)
History and Physical  Rebecca Chen KPT:[id number] DOB: 01-13-1946 DOA: 12/30/2018  Referring physician: Dr Alvino Chapel PCP: Antony Blackbird, MD  Outpatient Specialists: Dr Lanny Cramp GI Patient coming from: Home  Chief Complaint: Shortness of breath  HPI: Rebecca Chen is a 73 y.o. female with medical history significant for gastric ulcers in her 86s, CKD 3, hypertension, who presented to [facility] ED with complaints of gradually worsening shortness of breath of 3 months duration.  Associated with nonproductive cough and wheezing.  States she was seen by her primary care provider about 2 weeks ago.  Prescribed antibiotics.  First on Levaquin, finished course, then on azithromycin with prednisone with no improvement of symptoms.  Also reporting right lower extremity tenderness on palpation and bilateral lower extremity edema.  Last colonoscopy was in October by Dr. Lanny Cramp with no abnormal findings.  No chest pain or palpitations.  No recent traveling or known exposure to COVID-19.  ED Course: In the ED, vital signs essentially unremarkable.  Lab studies remarkable for significant anemia with hemoglobin of 5.1 with a baseline of 10.  Negative FOBT.  TRH asked to admit.  ED physician will consult GI.  Review of Systems: Review of systems as noted in the HPI. All other systems reviewed and are negative.   Past Medical History:  Diagnosis Date  . Hypertension    Past Surgical History:  Procedure Laterality Date  . FOOT SURGERY      Social History:  reports that she has never smoked. She has never used smokeless tobacco. No history on file for alcohol and drug.   No Known Allergies  History reviewed. No pertinent family history.  Denies family history of GI disease.  Prior to Admission medications   Medication Sig Start Date End Date Taking? Authorizing Provider  Ascorbic Acid (VITAMIN C PO) Take 1 tablet by mouth daily.   Yes [provider]  atenolol (TENORMIN) 50 MG tablet Take 50 mg by  mouth daily.   Yes [provider]  CALCIUM PO Take 1 tablet by mouth daily.   Yes [provider]  CRANBERRY PO Take 1 tablet by mouth daily.   Yes [provider]  Cyanocobalamin (VITAMIN B-12 PO) Take 1 tablet by mouth daily.   Yes [provider]  NIACIN PO Take 1 tablet by mouth daily.   Yes [provider]  predniSONE (DELTASONE) 20 MG tablet  12/23/18  Yes [provider]  Pyridoxine HCl (VITAMIN B-6 PO) Take 1 tablet by mouth daily.   Yes [provider]  simvastatin (ZOCOR) 40 MG tablet Take 40 mg by mouth daily.   Yes [provider]  VITAMIN D PO Take 1 tablet by mouth daily.   Yes [provider]  VITAMIN E PO Take 1 tablet by mouth daily.   Yes [provider]  zolpidem (AMBIEN) 10 MG tablet Take 10 mg by mouth at bedtime as needed for sleep.  12/15/18  Yes [provider]    Physical Exam: BP (!) 139/47   Pulse 69   Temp 97.7 F (36.5 C) (Oral)   Resp 16   Ht 5\' 7"  (1.702 m)   Wt 81.6 kg   SpO2 94%   BMI 28.19 kg/m   . General: 73 y.o. year-old female well developed well nourished in no acute distress.  Alert and oriented x3. . Cardiovascular: Regular rate and rhythm with no rubs or gallops.  No thyromegaly or JVD noted.  Trace lower extremity edema. 2/4 pulses  in all 4 extremities. [name] Respiratory: Clear to auscultation with no wheezes or rales. Good inspiratory effort. . Abdomen: Soft nontender nondistended with normal bowel sounds x4 quadrants. . Muskuloskeletal: No cyanosis or clubbing noted bilaterally.  Right calf tenderness on palpation. . Neuro: CN II-XII intact, strength, sensation, reflexes . Skin: No ulcerative lesions noted or rashes . Psychiatry: Judgement and insight appear normal. Mood is appropriate for condition and setting          Labs on Admission:  Basic Metabolic Panel: Recent Labs  Lab 12/30/18 1327  NA 137  K 3.7  CL 103  CO2 21*  GLUCOSE  179*  BUN 22  CREATININE 1.47*  CALCIUM 8.6*   Liver Function Tests: No results for input(s): AST, ALT, ALKPHOS, BILITOT, PROT, ALBUMIN in the last 168 hours. No results for input(s): LIPASE, AMYLASE in the last 168 hours. No results for input(s): AMMONIA in the last 168 hours. CBC: Recent Labs  Lab 12/30/18 1327  WBC 21.5*  NEUTROABS 18.4*  HGB 5.1*  HCT 18.4*  MCV 82.5  PLT 378   Cardiac Enzymes: No results for input(s): CKTOTAL, CKMB, CKMBINDEX, TROPONINI in the last 168 hours.  BNP (last 3 results) No results for input(s): BNP in the last 8760 hours.  ProBNP (last 3 results) No results for input(s): PROBNP in the last 8760 hours.  CBG: No results for input(s): GLUCAP in the last 168 hours.  Radiological Exams on Admission: Dg Chest 2 View  Result Date: 12/30/2018 CLINICAL DATA:  Pt c/o SOB x 3 months. Seen in Jan. and Dx with bronchitis, initially given inhaler, following month seen again and given Levaquin, seen again end of February and given Z-pack and steroids. Continuing SOB. Followed up with telemed a week ago and this morning. Increasing SOB Pt came to ED. EXAM: CHEST - 2 VIEW COMPARISON:  10/02/2018 FINDINGS: Cardiac silhouette is normal in size. Small hiatal hernia. No mediastinal or hilar masses or evidence of adenopathy. Stable right middle lobe granuloma. Lungs otherwise clear. No pleural effusion or pneumothorax. Skeletal structures are intact. IMPRESSION: No active cardiopulmonary disease. Electronically Signed   By: Lajean Manes M.D.   On: 12/30/2018 13:23    EKG: I independently viewed the EKG done and my findings are as followed: Sinus rhythm with rate of 77.  No specific ST-T changes.  Assessment/Plan Present on Admission: . Symptomatic anemia  Active Problems:   Symptomatic anemia  Severe symptomatic anemia Presented with ongoing dyspnea and hemoglobin 5.1 Baseline hemoglobin 10 Guaiac negative Obtain iron studies GI consulted by ED physician  Last colonoscopy was in October 2019 and non revealing per the patient Transfuse 2 unit PRBCs Recheck H&H in the morning  Right lower extremity pain Pain mostly with palpation Obtain Doppler ultrasound  Bilateral lower extremity edema Obtain BNP  CKD 3 Presented with creatinine 1.47 and GFR of 36 Baseline creatinine appears to be 1.3 with baseline of 41 Avoid nephrotoxic agents/dehydration/hypotension Monitor urine output Repeat BMP in the morning  Self-reported history of gastric ulcer Diagnosed in her 92s GI consulted by ED physician  Leukocytosis, possibly reactive Presented with WBC 21.5 Recently on antibiotics and steroids for bronchitis Repeat CBC in the morning No sign of active infective process  Recently treated bronchitis Management as stated above Currently O2 saturation 100% on room air Antitussive as needed  Hyperglycemia Possibly steroid-induced Obtain hemoglobin A1c Recently on prednisone 20 mg daily Hold off steroids for now Monitor blood sugar Add insulin sliding scale if hyperglycemia persists  Risks: High risk for decompensation due to anemia with unclear etiology in the setting of advanced age.  Patient will require at least 2 midnights for further evaluation and treatment of present condition.   DVT prophylaxis: Subcu heparin 3 times daily  Code Status: Full code  Family Communication: None at bedside  Disposition Plan: Admit to telemetry unit  Consults called: GI will be consulted by ED physician  Admission status: Inpatient status    Kayleen Memos MD [facility] Pager [phone]  If 7PM-7AM, please contact night-coverage [email or web address] Password [facility]  12/30/2018, 2:59 PM

## 2018-12-30 NOTE — ED Notes (Signed)
ED TO INPATIENT HANDOFF REPORT  ED Nurse Name and Phone #: Tana Coast Name/Age/Gender Rebecca Chen 73 y.o. female Room/Bed: WA07/WA07  Code Status   Code Status: Full Code  Home/SNF/Other Home Patient oriented to: self, place, time and situation Is this baseline? Yes   Triage Complete: Triage complete  Chief Complaint Shortness of Breath  Triage Note Pt c/o SOB x 3 months. Seen in Jan. and Dx with bronchitis, initially given inhaler, following month seen again and given Levaquin, seen again end of February and given Z-pack and steroids. Continuing SOB. Followed up with telemed a week ago and this morning. Increasing SOB Pt came to ED.   Allergies No Known Allergies  Level of Care/Admitting Diagnosis ED Disposition    ED Disposition Condition Comment   Admit  Hospital Area: [facility] [[id number]]  Level of Care: Telemetry [5]  Admit to tele based on following criteria: Monitor for Ischemic changes  Diagnosis: Symptomatic anemia [[id number]]  Admitting Physician: Kayleen Memos [[id number]]  Attending Physician: Kayleen Memos [[id number]]  Estimated length of stay: past midnight tomorrow  Certification:: I certify this patient will need inpatient services for at least 2 midnights  PT Class (Do Not Modify): Inpatient [101]  PT Acc Code (Do Not Modify): Private [1]       B Medical/Surgery History Past Medical History:  Diagnosis Date  . Hypertension    Past Surgical History:  Procedure Laterality Date  . FOOT SURGERY       A IV Location/Drains/Wounds Patient Lines/Drains/Airways Status   Active Line/Drains/Airways    Name:   Placement date:   Placement time:   Site:   Days:   Peripheral IV 12/30/18 Left Antecubital   12/30/18    1326    Antecubital   less than 1          Intake/Output Last 24 hours No intake or output data in the 24 hours ending 12/30/18 1520  Labs/Imaging Results for orders placed or performed during the hospital  encounter of 12/30/18 (from the past 48 hour(s))  CBC with Differential     Status: Abnormal   Collection Time: 12/30/18  1:27 PM  Result Value Ref Range   WBC 21.5 (H) 4.0 - 10.5 K/uL   RBC 2.23 (L) 3.87 - 5.11 MIL/uL   Hemoglobin 5.1 (LL) 12.0 - 15.0 g/dL    Comment: REPEATED TO VERIFY THIS CRITICAL RESULT HAS VERIFIED AND BEEN CALLED TO ZULETA,C. RN BY KATHLEEN COHEN ON 04 01 2020 AT 6948, AND HAS BEEN READ BACK. CRITICAL RESULT VERIFIED    HCT 18.4 (L) 36.0 - 46.0 %   MCV 82.5 80.0 - 100.0 fL   MCH 22.9 (L) 26.0 - 34.0 pg   MCHC 27.7 (L) 30.0 - 36.0 g/dL   RDW 17.0 (H) 11.5 - 15.5 %   Platelets 378 150 - 400 K/uL   nRBC 0.1 0.0 - 0.2 %   Neutrophils Relative % 86 %   Neutro Abs 18.4 (H) 1.7 - 7.7 K/uL   Lymphocytes Relative 8 %   Lymphs Abs 1.8 0.7 - 4.0 K/uL   Monocytes Relative 5 %   Monocytes Absolute 1.0 0.1 - 1.0 K/uL   Eosinophils Relative 0 %   Eosinophils Absolute 0.1 0.0 - 0.5 K/uL   Basophils Relative 0 %   Basophils Absolute 0.0 0.0 - 0.1 K/uL   Immature Granulocytes 1 %   Abs Immature Granulocytes 0.19 (H) 0.00 - 0.07 K/uL    Comment:  Performed at [facility], [place] [address] [place], [place] [address]  Basic metabolic panel     Status: Abnormal   Collection Time: 12/30/18  1:27 PM  Result Value Ref Range   Sodium 137 135 - 145 mmol/L   Potassium 3.7 3.5 - 5.1 mmol/L   Chloride 103 98 - 111 mmol/L   CO2 21 (L) 22 - 32 mmol/L   Glucose, Bld 179 (H) 70 - 99 mg/dL   BUN 22 8 - 23 mg/dL   Creatinine, Ser 1.47 (H) 0.44 - 1.00 mg/dL   Calcium 8.6 (L) 8.9 - 10.3 mg/dL   GFR calc non Af Amer 36 (L) >60 mL/min   GFR calc Af Amer 41 (L) >60 mL/min   Anion gap 13 5 - 15    Comment: Performed at [facility], [place] [address] [place], [place] [address]   Dg Chest 2 View  Result Date: 12/30/2018 CLINICAL DATA:  Pt c/o SOB x 3 months. Seen in Jan. and Dx with bronchitis, initially given inhaler, following month seen again and  given Levaquin, seen again end of February and given Z-pack and steroids. Continuing SOB. Followed up with telemed a week ago and this morning. Increasing SOB Pt came to ED. EXAM: CHEST - 2 VIEW COMPARISON:  10/02/2018 FINDINGS: Cardiac silhouette is normal in size. Small hiatal hernia. No mediastinal or hilar masses or evidence of adenopathy. Stable right middle lobe granuloma. Lungs otherwise clear. No pleural effusion or pneumothorax. Skeletal structures are intact. IMPRESSION: No active cardiopulmonary disease. Electronically Signed   By: Lajean Manes M.D.   On: 12/30/2018 13:23    Pending Labs Unresulted Labs (From admission, onward)    Start     Ordered   12/31/18 0500  CBC with Differential/Platelet  Tomorrow morning,   R     12/30/18 1519   12/31/18 1694  Basic metabolic panel  Tomorrow morning,   R     12/30/18 1519   12/31/18 0500  Hemoglobin A1c  Tomorrow morning,   R     12/30/18 1519   12/30/18 1517  Type and screen [place]  Once,   R    Comments:  [place]    12/30/18 1516   12/30/18 1517  Prepare RBC  (Adult Blood Administration - Red Blood Cells)  Once,   R    Question Answer Comment  # of Units 2 units   Transfusion Indications Symptomatic Anemia   If emergent release call blood bank [facility] [phone]   Instructions: Transfuse      12/30/18 1516          Vitals/Pain Today's Vitals   12/30/18 1326 12/30/18 1330 12/30/18 1435 12/30/18 1500  BP: (!) 126/59 (!) 111/59 (!) 139/47 (!) 123/50  Pulse: 78 77 69 79  Resp: 16  16   Temp:      TempSrc:      SpO2: 100% 100% 94% (!) 87%  Weight:      Height:      PainSc:        Isolation Precautions No active isolations  Medications Medications  0.9 %  sodium chloride infusion (Manually program via Guardrails IV Fluids) (has no administration in time range)  acetaminophen (TYLENOL) tablet 650 mg (has no administration in time range)  ondansetron (ZOFRAN)  injection 4 mg (has no administration in time range)  vitamin C (ASCORBIC ACID) tablet 500 mg (has no administration in time range)  atenolol (TENORMIN) tablet 50 mg (has no administration in time range)  vitamin B-12 (CYANOCOBALAMIN) tablet 250 mcg (has no administration in time range)  simvastatin (ZOCOR) tablet 40 mg (has no administration in time range)    Mobility walks     Focused Assessments Anemia   R Recommendations: See Admitting Provider Note  Report given to:   Additional Notes: .

## 2018-12-30 NOTE — Consult Note (Addendum)
Referring Provider:  Dr. Alvino Chapel  Primary Care Physician:  Maurice Small, MD Primary Gastroenterologist:  Dr. Paulita Fujita   Reason for Consultation: Symptomatic anemia  HPI: Rebecca Chen is a 73 y.o. female with past medical history of hypertension , mild chronic kidney disease presented to the hospital with progressively worsening shortness of breath of 3 months duration.  Upon initial evaluation she was found to have severe anemia with hemoglobin of 5.1.  Occult blood negative.  GI is consulted for further evaluation.  Patient seen and examined at bedside.  She is denying any black tarry stool or bright red blood per rectum.  Denies nausea vomiting.  She had an episode of diarrhea yesterday but not sure of color of the stool.  Occasional lower quadrant discomfort which started yesterday.  Denies unintentional weight loss.  Denies NSAID use.  Denies reflux, dysphagia or odynophagia.   having shortness of breath since January of this year.  Her symptoms are better with prednisone use.   Last colonoscopy in November 2019 by Dr. Paulita Fujita showed diverticulosis and hemorrhoids.  Repeat was recommended in 5 years because of personal history of adenomatous polyp.  Past Medical History:  Diagnosis Date  . Hypertension     Past Surgical History:  Procedure Laterality Date  . FOOT SURGERY      Prior to Admission medications   Medication Sig Start Date End Date Taking? Authorizing Provider  Ascorbic Acid (VITAMIN C PO) Take 1 tablet by mouth daily.   Yes [provider]  atenolol (TENORMIN) 50 MG tablet Take 50 mg by mouth daily.   Yes [provider]  CALCIUM PO Take 1 tablet by mouth daily.   Yes [provider]  CRANBERRY PO Take 1 tablet by mouth daily.   Yes [provider]  Cyanocobalamin (VITAMIN B-12 PO) Take 1 tablet by mouth daily.   Yes [provider]  NIACIN PO Take 1 tablet by mouth daily.   Yes [provider]  predniSONE  (DELTASONE) 20 MG tablet  12/23/18  Yes [provider]  Pyridoxine HCl (VITAMIN B-6 PO) Take 1 tablet by mouth daily.   Yes [provider]  simvastatin (ZOCOR) 40 MG tablet Take 40 mg by mouth daily.   Yes [provider]  VITAMIN D PO Take 1 tablet by mouth daily.   Yes [provider]  VITAMIN E PO Take 1 tablet by mouth daily.   Yes [provider]  zolpidem (AMBIEN) 10 MG tablet Take 10 mg by mouth at bedtime as needed for sleep.  12/15/18  Yes [provider]    Scheduled Meds: . sodium chloride   Intravenous Once  . atenolol  50 mg Oral Daily  . simvastatin  40 mg Oral Daily  . vitamin B-12  250 mcg Oral Daily  . vitamin C  500 mg Oral Daily   Continuous Infusions: PRN Meds:.acetaminophen, ondansetron (ZOFRAN) IV  Allergies as of 12/30/2018  . (No Known Allergies)    History reviewed. No pertinent family history.  Social History   Socioeconomic History  . Marital status: Single    Spouse name: Not on file  . Number of children: Not on file  . Years of education: Not on file  . Highest education level: Not on file  Occupational History  . Not on file  Social Needs  . Financial resource strain: Not on file  . Food insecurity:    Worry: Not on file    Inability: Not on  file  . Transportation needs:    Medical: Not on file    Non-medical: Not on file  Tobacco Use  . Smoking status: Never Smoker  . Smokeless tobacco: Never Used  Substance and Sexual Activity  . Alcohol use: Not on file  . Drug use: Not on file  . Sexual activity: Not on file  Lifestyle  . Physical activity:    Days per week: Not on file    Minutes per session: Not on file  . Stress: Not on file  Relationships  . Social connections:    Talks on phone: Not on file    Gets together: Not on file    Attends religious service: Not on file    Active member of club or organization: Not on file    Attends meetings of clubs or organizations: Not  on file    Relationship status: Not on file  . Intimate partner violence:    Fear of current or ex partner: Not on file    Emotionally abused: Not on file    Physically abused: Not on file    Forced sexual activity: Not on file  Other Topics Concern  . Not on file  Social History Narrative  . Not on file    Review of Systems: Review of Systems  Constitutional: Positive for malaise/fatigue. Negative for chills and fever.  HENT: Negative for hearing loss and tinnitus.   Eyes: Negative for blurred vision and double vision.  Respiratory: Positive for cough and shortness of breath. Negative for hemoptysis.   Cardiovascular: Negative for chest pain and palpitations.  Gastrointestinal: Positive for diarrhea. Negative for abdominal pain, blood in stool, heartburn, melena, nausea and vomiting.  Genitourinary: Negative for dysuria and urgency.  Musculoskeletal: Negative for myalgias and neck pain.  Skin: Negative for itching and rash.  Neurological: Negative for seizures and loss of consciousness.  Endo/Heme/Allergies: Does not bruise/bleed easily.  Psychiatric/Behavioral: Negative for memory loss and substance abuse.    Physical Exam: Vital signs: Vitals:   12/30/18 1435 12/30/18 1500  BP: (!) 139/47 (!) 123/50  Pulse: 69 79  Resp: 16   Temp:    SpO2: 94% (!) 87%     Physical Exam  Constitutional: She is oriented to person, place, and time. She appears well-developed and well-nourished.  HENT:  Head: Normocephalic and atraumatic.  Mouth/Throat: Oropharynx is clear and moist. No oropharyngeal exudate.  Eyes: EOM are normal. No scleral icterus.  Neck: Normal range of motion. Neck supple.  Cardiovascular: Normal rate, regular rhythm and normal heart sounds.  Pulmonary/Chest: Effort normal and breath sounds normal. No respiratory distress.  Abdominal: Soft. Bowel sounds are normal. She exhibits no distension. There is no abdominal tenderness. There is no rebound and no guarding.   Musculoskeletal: Normal range of motion.        General: No edema.  Neurological: She is alert and oriented to person, place, and time.  Skin: Skin is warm. No erythema.  Psychiatric: She has a normal mood and affect. Her behavior is normal. Judgment and thought content normal.  Vitals reviewed.  GI:  Lab Results: Recent Labs    12/30/18 1327  WBC 21.5*  HGB 5.1*  HCT 18.4*  PLT 378   BMET Recent Labs    12/30/18 1327  NA 137  K 3.7  CL 103  CO2 21*  GLUCOSE 179*  BUN 22  CREATININE 1.47*  CALCIUM 8.6*   LFT No results for input(s): PROT, ALBUMIN, AST, ALT, ALKPHOS, BILITOT, BILIDIR,  IBILI in the last 72 hours. PT/INR No results for input(s): LABPROT, INR in the last 72 hours.   Studies/Results: Dg Chest 2 View  Result Date: 12/30/2018 CLINICAL DATA:  Pt c/o SOB x 3 months. Seen in Jan. and Dx with bronchitis, initially given inhaler, following month seen again and given Levaquin, seen again end of February and given Z-pack and steroids. Continuing SOB. Followed up with telemed a week ago and this morning. Increasing SOB Pt came to ED. EXAM: CHEST - 2 VIEW COMPARISON:  10/02/2018 FINDINGS: Cardiac silhouette is normal in size. Small hiatal hernia. No mediastinal or hilar masses or evidence of adenopathy. Stable right middle lobe granuloma. Lungs otherwise clear. No pleural effusion or pneumothorax. Skeletal structures are intact. IMPRESSION: No active cardiopulmonary disease. Electronically Signed   By: Lajean Manes M.D.   On: 12/30/2018 13:23    Impression/Plan: -Symptomatic anemia with hemoglobin of 5.1.  Occult Blood negative.  No overt bleeding.  Hemoglobin was around 10.4 in January 2020 and was normal in 2018. -Chronic kidney disease. -Leukocytosis.  Could be reactive. -History of adenomatous polyp.  Last colonoscopy November 2019 showed no polyp.  Showed diverticulosis and hemorrhoids.  Recommendations --------------------------- -No evidence of overt  bleeding.  Occult blood negative. -Transfuse as needed to keep hemoglobin around 7-8. -Monitor H&H. -May need EGD  at some point, which could be done as an outpatient. -GI will follow    LOS: 0 days   Otis Brace  MD, FACP 12/30/2018, 3:32 PM  Contact #  [phone]

## 2018-12-31 ENCOUNTER — Inpatient Hospital Stay (HOSPITAL_COMMUNITY): Payer: Medicare Other

## 2018-12-31 DIAGNOSIS — I82401 Acute embolism and thrombosis of unspecified deep veins of right lower extremity: Secondary | ICD-10-CM

## 2018-12-31 DIAGNOSIS — D509 Iron deficiency anemia, unspecified: Secondary | ICD-10-CM

## 2018-12-31 DIAGNOSIS — R609 Edema, unspecified: Secondary | ICD-10-CM

## 2018-12-31 DIAGNOSIS — M7989 Other specified soft tissue disorders: Secondary | ICD-10-CM

## 2018-12-31 LAB — CBC WITH DIFFERENTIAL/PLATELET
Abs Immature Granulocytes: 0.1 10*3/uL — ABNORMAL HIGH (ref 0.00–0.07)
Basophils Absolute: 0 10*3/uL (ref 0.0–0.1)
Basophils Relative: 0 %
Eosinophils Absolute: 0.1 10*3/uL (ref 0.0–0.5)
Eosinophils Relative: 0 %
HCT: 25.7 % — ABNORMAL LOW (ref 36.0–46.0)
Hemoglobin: 7.8 g/dL — ABNORMAL LOW (ref 12.0–15.0)
Immature Granulocytes: 1 %
Lymphocytes Relative: 13 %
Lymphs Abs: 2.2 10*3/uL (ref 0.7–4.0)
MCH: 24.9 pg — ABNORMAL LOW (ref 26.0–34.0)
MCHC: 30.4 g/dL (ref 30.0–36.0)
MCV: 82.1 fL (ref 80.0–100.0)
Monocytes Absolute: 1.2 10*3/uL — ABNORMAL HIGH (ref 0.1–1.0)
Monocytes Relative: 7 %
Neutro Abs: 13.4 10*3/uL — ABNORMAL HIGH (ref 1.7–7.7)
Neutrophils Relative %: 79 %
Platelets: 297 10*3/uL (ref 150–400)
RBC: 3.13 MIL/uL — ABNORMAL LOW (ref 3.87–5.11)
RDW: 15.9 % — ABNORMAL HIGH (ref 11.5–15.5)
WBC: 16.9 10*3/uL — ABNORMAL HIGH (ref 4.0–10.5)
nRBC: 0.2 % (ref 0.0–0.2)

## 2018-12-31 LAB — BASIC METABOLIC PANEL
Anion gap: 10 (ref 5–15)
BUN: 20 mg/dL (ref 8–23)
CO2: 22 mmol/L (ref 22–32)
Calcium: 8.8 mg/dL — ABNORMAL LOW (ref 8.9–10.3)
Chloride: 107 mmol/L (ref 98–111)
Creatinine, Ser: 0.99 mg/dL (ref 0.44–1.00)
GFR calc Af Amer: >60 mL/min (ref >60)
GFR calc non Af Amer: 58 mL/min — ABNORMAL LOW (ref >60)
Glucose, Bld: 94 mg/dL (ref 70–99)
Potassium: 4.2 mmol/L (ref 3.5–5.1)
Sodium: 139 mmol/L (ref 135–145)

## 2018-12-31 LAB — BPAM RBC
Blood Product Expiration Date: 202004192359
Blood Product Expiration Date: 202004192359
ISSUE DATE / TIME: 202004011705
ISSUE DATE / TIME: 202004012038
Unit Type and Rh: 5100
Unit Type and Rh: 5100

## 2018-12-31 LAB — HEMOGLOBIN A1C
Hgb A1c MFr Bld: 5.7 % — ABNORMAL HIGH (ref 4.8–5.6)
Mean Plasma Glucose: 116.89 mg/dL

## 2018-12-31 LAB — TYPE AND SCREEN
ABO/RH(D): O POS
Antibody Screen: NEGATIVE
Unit division: 0
Unit division: 0

## 2018-12-31 LAB — BRAIN NATRIURETIC PEPTIDE: B Natriuretic Peptide: 93.5 pg/mL (ref 0.0–100.0)

## 2018-12-31 LAB — ABO/RH: ABO/RH(D): O POS

## 2018-12-31 MED ORDER — HYDROCODONE-ACETAMINOPHEN 5-325 MG PO TABS: 1.0000 | ORAL_TABLET | Freq: Four times a day (QID) | ORAL | Status: DC | PRN

## 2018-12-31 MED ORDER — PANTOPRAZOLE SODIUM 40 MG PO TBEC
40.0000 mg | DELAYED_RELEASE_TABLET | Freq: Every day | ORAL | Status: DC
  Administered 2018-12-31: 40 mg via ORAL
  Filled 2018-12-31: qty 1

## 2018-12-31 MED ORDER — HYDROCODONE-ACETAMINOPHEN 5-325 MG PO TABS
2.0000 | ORAL_TABLET | Freq: Once | ORAL | Status: AC
  Administered 2018-12-31: 1 via ORAL
  Filled 2018-12-31: qty 2

## 2018-12-31 NOTE — Consult Note (Signed)
Chief Complaint: Patient was seen in consultation today for IVC filter placement.  Referring Physician(s): Dr. Cordelia Poche  Supervising Physician: Corrie Mckusick  Patient Status: [facility] - In-pt  History of Present Illness: Rebecca Chen is a 73 y.o. female with a past medical history significant for HTN who presented to [facility] ED on 12/30/18 with complaints of dyspnea and cough x3 months. She had been seen in the ER in January for the same reason and was diagnosed with bronchitis. She continued to have symptoms for which she then presented to her PCP where she was treated with two different antibiotics and two rounds of steroids without much improvement. She also complained of chronic leg swelling with some increased tenderness to her right leg. Initial lab work showed hemoglobin of 5.1 (previously 10.4 during January ER visit), noted to be hypoxic with ambulation and FOBT was negative - she was admitted for further evaluation and management. She was transfused 2 units PRBCs and GI was consulted. She reports that she is followed by Dr. Paulita Fujita with last colonoscopy 07/2018 which showed diverticulosis and hemorrhoids. GI recommended transfusions PRN for hemoglobin 7-8 and outpatient EGD. A bilateral lower extremity ultrasound was obtained due to right leg pain and was found to be positive for right posterior tibial vein DVT. IR has been consulted for IVC filter placement due to concern for starting anticoagulation in a patient with symptomatic anemia with currently unknown etiology.   Patient seen in her room by myself and Dr. Earleen Newport to discuss possible IVC filter placement. Patient denies any personal or family history of blood clots, previous blood thinner use or trauma to her RLE. She does endorse decreased activity over the last 2-3 months due to dyspnea, cough, fatigue and general malaise. 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 She reports wearing compression stockings at work. She reports worsening RLE pain and difficulty walking due to the pain. She states that she was initially hesitant to undergo EGD or IVC filter placement although she has since changed her mind about both as she would like to further investigate the cause of her anemia and would prefer protection from PE while her anemia is being investigated. Discussed with patient typical risk factors for DVT, US findings which cannot determine whether this DVT is acute or chronic, concern for possibly starting blood thinners which is typical first line treatment, risks with IVC filter placement (most notably in her case risk of ileocaval thrombosis) as well as recommendations from the [employer] of [facility] [employer] regarding surveillance/treatment of a patient with minimal DVT risk factors including routine [place] monitoring and if the clot were to enlarge then pursuing further treatments. Patient states understanding to the above discussion and is very motivated to proceed with IVC filter placement.   Past Medical History:  Diagnosis Date   Hypertension     Past Surgical History:  Procedure Laterality Date   FOOT SURGERY      Allergies: Patient has no known allergies.  Medications: Prior to Admission medications   Medication Sig Start Date End Date Taking? Authorizing Provider  Ascorbic Acid (VITAMIN C PO) Take 1 tablet by mouth daily.   Yes [provider]  atenolol (TENORMIN) 50 MG tablet Take 50 mg by mouth daily.   Yes [provider]  CALCIUM PO Take 1 tablet by mouth daily.   Yes [provider]  Drusilla Kanner  PO Take 1 tablet by mouth daily.   Yes [provider]  Cyanocobalamin (VITAMIN B-12 PO) Take 1 tablet by mouth daily.   Yes [provider]  NIACIN PO Take 1 tablet by mouth daily.   Yes [provider]  predniSONE (DELTASONE) 20 MG tablet  12/23/18  Yes [provider]  Pyridoxine HCl (VITAMIN B-6 PO) Take 1 tablet by mouth daily.   Yes [provider]  simvastatin (ZOCOR) 40 MG tablet Take 40 mg by mouth daily.   Yes [provider]  VITAMIN D PO Take 1 tablet by mouth daily.   Yes [provider]  VITAMIN E PO Take 1 tablet by mouth daily.   Yes [provider]  zolpidem (AMBIEN) 10 MG tablet Take 10 mg by mouth at bedtime as needed for sleep.  12/15/18  Yes [provider]     History reviewed. No pertinent family history.  Social History   Socioeconomic History   Marital status: Single    Spouse name: Not on file   Number of children: Not on file   Years of education: Not on file   Highest education level: Not on file  Occupational History   Not on file  Social Needs   Financial resource strain: Not on file   Food insecurity:    Worry: Not on file    Inability: Not on file   Transportation needs:    Medical: Not on file    Non-medical: Not on file  Tobacco Use   Smoking status: Never Smoker   Smokeless tobacco: Never Used  Substance and Sexual Activity   Alcohol use: Not on file   Drug use: Not on file   Sexual activity: Not on file  Lifestyle   Physical activity:    Days per week: Not on file    Minutes per session: Not on file   Stress: Not on file  Relationships   Social connections:    Talks on phone: Not on file    Gets together: Not on file    Attends religious service: Not on file    Active member of club or organization: Not on file    Attends meetings of clubs or organizations: Not on file    Relationship status: Not on file  Other Topics Concern   Not on file  Social History Narrative   Not on file     Review of Systems: A 12 point ROS discussed and pertinent positives are indicated in the HPI above.  All other systems are negative.  Review of Systems  Constitutional: Positive for fatigue.  Respiratory: Negative for cough and  shortness of breath.   Cardiovascular: Positive for leg swelling (chronic bilateral - worsening RLE swelling since admission). Negative for chest pain.  Gastrointestinal: Negative for abdominal pain, blood in stool, diarrhea, nausea and vomiting.  Musculoskeletal:       (+) RLE pain  Skin: Negative for color change.  Neurological: Negative for dizziness and light-headedness.    Vital Signs: BP (!) 106/93 (BP Location: Right Arm)    Pulse 74    Temp 99.1 F (37.3 C) (Oral)    Resp 20    Ht 5\' 7"  (1.702 m)    Wt 180 lb (81.6 kg)    SpO2 100%    BMI 28.19 kg/m   Physical Exam Vitals signs and nursing note reviewed.  Constitutional:      General: She is not in acute distress.  Comments: Patient laying in window seat area upon arrival to room. During discussion sits, stands and ambulates without noticeable difficulty. Able to speak in complete sentences with ease. Pleasant, talkative, good historian.  HENT:     Head: Normocephalic.  Cardiovascular:     Rate and Rhythm: Normal rate.  Pulmonary:     Effort: Pulmonary effort is normal.  Musculoskeletal:        General: Swelling (BLE) present.  Skin:    General: Skin is warm and dry.  Neurological:     Mental Status: She is alert and oriented to person, place, and time.  Psychiatric:        Mood and Affect: Mood normal.        Behavior: Behavior normal.        Thought Content: Thought content normal.        Judgment: Judgment normal.      Imaging: Dg Chest 2 View  Result Date: 12/30/2018 CLINICAL DATA:  Pt c/o SOB x 3 months. Seen in Jan. and Dx with bronchitis, initially given inhaler, following month seen again and given Levaquin, seen again end of February and given Z-pack and steroids. Continuing SOB. Followed up with telemed a week ago and this morning. Increasing SOB Pt came to ED. EXAM: CHEST - 2 VIEW COMPARISON:  10/02/2018 FINDINGS: Cardiac silhouette is normal in size. Small hiatal hernia. No mediastinal or hilar masses  or evidence of adenopathy. Stable right middle lobe granuloma. Lungs otherwise clear. No pleural effusion or pneumothorax. Skeletal structures are intact. IMPRESSION: No active cardiopulmonary disease. Electronically Signed   By: Lajean Manes M.D.   On: 12/30/2018 13:23   Vas [place] Lower Extremity Venous (dvt)  Result Date: 12/31/2018  Lower Venous Study Indications: Swelling, and Edema.  Performing Technologist: Abram Sander RVS  Examination Guidelines: A complete evaluation includes B-mode imaging, spectral Doppler, color Doppler, and power Doppler as needed of all accessible portions of each vessel. Bilateral testing is considered an integral part of a complete examination. Limited examinations for reoccurring indications may be performed as noted.  Right Venous Findings: +---------+---------------+---------+-----------+----------+--------------+            Compressibility Phasicity Spontaneity Properties Summary         +---------+---------------+---------+-----------+----------+--------------+  CFV       Full            Yes       Yes                                    +---------+---------------+---------+-----------+----------+--------------+  SFJ       Full                                                             +---------+---------------+---------+-----------+----------+--------------+  FV Prox   Full                                                             +---------+---------------+---------+-----------+----------+--------------+  FV Mid    Full                                                             +---------+---------------+---------+-----------+----------+--------------+  FV Distal Full                                                             +---------+---------------+---------+-----------+----------+--------------+  PFV       Full                                                             +---------+---------------+---------+-----------+----------+--------------+  POP       Full             Yes       Yes                                    +---------+---------------+---------+-----------+----------+--------------+  PTV       None                                             Acute           +---------+---------------+---------+-----------+----------+--------------+  PERO                                                       Not visualized  +---------+---------------+---------+-----------+----------+--------------+  Left Venous Findings: +---------+---------------+---------+-----------+----------+-------+            Compressibility Phasicity Spontaneity Properties Summary  +---------+---------------+---------+-----------+----------+-------+  CFV       Full            Yes       Yes                             +---------+---------------+---------+-----------+----------+-------+  SFJ       Full                                                      +---------+---------------+---------+-----------+----------+-------+  FV Prox   Full                                                      +---------+---------------+---------+-----------+----------+-------+  FV Mid    Full                                                      +---------+---------------+---------+-----------+----------+-------+  FV Distal Full                                                      +---------+---------------+---------+-----------+----------+-------+  PFV       Full                                                      +---------+---------------+---------+-----------+----------+-------+  POP       Full            Yes       Yes                             +---------+---------------+---------+-----------+----------+-------+  PTV       Full                                                      +---------+---------------+---------+-----------+----------+-------+  PERO      Full                                                      +---------+---------------+---------+-----------+----------+-------+    Summary: Right: Findings consistent with  acute deep vein thrombosis involving the right posterior tibial vein. No cystic structure found in the popliteal fossa. Left: There is no evidence of deep vein thrombosis in the lower extremity. No cystic structure found in the popliteal fossa.  *See table(s) above for measurements and observations.    Preliminary     Labs:  CBC: Recent Labs    10/02/18 1213 12/30/18 1327 12/31/18 0423  WBC 7.4 21.5* 16.9*  HGB 10.4* 5.1* 7.8*  HCT 33.6* 18.4* 25.7*  PLT 358 378 297    COAGS: No results for input(s): INR, APTT in the last 8760 hours.  BMP: Recent Labs    10/02/18 1213 12/30/18 1327 12/31/18 0423  NA 139 137 139  K 4.4 3.7 4.2  CL 107 103 107  CO2 25 21* 22  GLUCOSE 102* 179* 94  BUN 15 22 20   CALCIUM 9.4 8.6* 8.8*  CREATININE 1.31* 1.47* 0.99  GFRNONAA 41* 36* 58*  GFRAA 48* 41* >60    LIVER FUNCTION TESTS: No results for input(s): BILITOT, AST, ALT, ALKPHOS, PROT, ALBUMIN in the last 8760 hours.  TUMOR MARKERS: No results for input(s): AFPTM, CEA, CA199, CHROMGRNA in the last 8760 hours.  Assessment and Plan:  73 y/o F who presented to [facility] ED on 4/1 with complaints of dyspnea, cough and lower extremity edema x3 months, found to have hgb 5.1 for which she underwent 2 unit PRBC transfusion which improved her dyspnea and cough - hgb today 7.8. She does not have an obvious etiology for her anemia at this time. She was seen by GI during this admission who recommended EGD however patient initially refused - today she states that she would like to undergo EGD. She has also developed worsening RLE pain during admission and BLE [place] was obtained which was (+) for right posterior tibial DVT - request was made to IR for IVC filter placement due to concern for initiating anticoagulation in a patient without a known cause for her profound anemia.   Lengthy discussion today with  patient by Dr. Earleen Newport regarding risks, benefits, alternatives to IVC filter placement as well as typical  timeframe for follow up and removal of IVC filter. Ms. Garritano is very motivated to have IVC filter placed as she is concerned about PE while her anemia is being further evaluated. Will plan for IVC filter placement tomorrow (4/3) in IR. Orders have been placed for NPO after midnight, AM labs.   Risks and benefits discussed with the patient including, but not limited to bleeding, infection, contrast induced renal failure, filter fracture or migration which can lead to emergency surgery or even death, strut penetration with damage or irritation to adjacent structures and caval thrombosis.  All of the patient's questions were answered, patient is agreeable to proceed.  Consent signed and in chart.  Thank you for this interesting consult.  I greatly enjoyed meeting Rebecca Chen and look forward to participating in their care.  A copy of this report was sent to the requesting provider on this date.  Electronically Signed: Joaquim Nam, PA-C 12/31/2018, 3:58 PM   I spent a total of 10 Miinutes  in face to face in clinical consultation, greater than 50% of which was counseling/coordinating care for IVC filter placement.

## 2018-12-31 NOTE — Progress Notes (Signed)
PROGRESS NOTE    Rebecca Chen  MLY:[id number] DOB: 25-Apr-1946 DOA: 12/30/2018 PCP: Maurice Small, MD   Brief Narrative: Rebecca Chen is a 73 y.o. female with medical history significant for gastric ulcers in her 47s, CKD 3, hypertension. Patient presented secondary to shortness of breath and found to have profound anemia. She received a blood transfusion which has improved symptoms, however, found to have an acute DVT.   Assessment & Plan:   Active Problems:   Symptomatic anemia   Symptomatic anemia Patient is iron deficient. Unknown cause. GI on board. Recent colonoscopy. Complicated by acute DVT. S/p 2 units PRBC. -GI recommendations -Will give IV iron prior to discharge -Orthostatic vitals  Shortness of breath Improved with blood transfusion. Also with an acute DVT, so PE on differential. No hypoxia, tachycardia, chest pain or hemoptysis at this time, however. -Ambulate with pulse ox  Right acute LE DVT Cannot anticoagulate at this time secondary to possible GI bleeding. Patient not wanting to undergo endoscopy at this time. -IR consult for possible IVC filter  AKI Unknown baseline. Creatinine of 1.47 on admission. Resolved now.  Leukocytosis Likely reactive. Improved today.  Hyperglycemia Hemoglobin A1C of 5.7%. Recommend diet control.   DVT prophylaxis: SCDs Code Status:   Code Status: Full Code Family Communication: None at bedside Disposition Plan: Discharge in 24-48 hours with new diagnosis of DVT with inability to start anticoagulation at this time secondary to anemia of unclear etiology   Consultants:   GI  IR  Procedures:   LE duplex (12/30/2018)  2 units PRBC (12/30/2018)  Antimicrobials:  None    Subjective: Feels better than yesterday. Had some lightheadedness while ambulating to the bathroom.  Objective: Vitals:   12/30/18 2051 12/31/18 0035 12/31/18 0523 12/31/18 1019  BP: 136/63 (!) 157/74 (!) 141/77 (!) 157/73  Pulse: 89 76 75  81  Resp: 16 16 20    Temp: 98.2 F (36.8 C) 99.2 F (37.3 C) 99.1 F (37.3 C)   TempSrc: Oral Oral Oral   SpO2: 100% 100% 97%   Weight:      Height:        Intake/Output Summary (Last 24 hours) at 12/31/2018 1142 Last data filed at 12/31/2018 0751 Gross per 24 hour  Intake 1694 ml  Output -  Net 1694 ml   Filed Weights   12/30/18 1252  Weight: 81.6 kg    Examination:  General exam: Appears calm and comfortable Respiratory system: Clear to auscultation. Respiratory effort normal. Cardiovascular system: S1 & S2 heard, RRR. No murmurs, rubs, gallops or clicks. Gastrointestinal system: Abdomen is nondistended, soft and nontender. No organomegaly or masses felt. Normal bowel sounds heard. Central nervous system: Alert and oriented. No focal neurological deficits. Extremities: No edema. No calf tenderness Skin: No cyanosis. No rashes Psychiatry: Judgement and insight appear normal. Mood & affect appropriate.     Data Reviewed: I have personally reviewed following labs and imaging studies  CBC: Recent Labs  Lab 12/30/18 1327 12/31/18 0423  WBC 21.5* 16.9*  NEUTROABS 18.4* 13.4*  HGB 5.1* 7.8*  HCT 18.4* 25.7*  MCV 82.5 82.1  PLT 378 812   Basic Metabolic Panel: Recent Labs  Lab 12/30/18 1327 12/31/18 0423  NA 137 139  K 3.7 4.2  CL 103 107  CO2 21* 22  GLUCOSE 179* 94  BUN 22 20  CREATININE 1.47* 0.99  CALCIUM 8.6* 8.8*   GFR: Estimated Creatinine Clearance: 58.1 mL/min (by C-G formula based on SCr of 0.99 mg/dL). Liver  Function Tests: No results for input(s): AST, ALT, ALKPHOS, BILITOT, PROT, ALBUMIN in the last 168 hours. No results for input(s): LIPASE, AMYLASE in the last 168 hours. No results for input(s): AMMONIA in the last 168 hours. Coagulation Profile: No results for input(s): INR, PROTIME in the last 168 hours. Cardiac Enzymes: No results for input(s): CKTOTAL, CKMB, CKMBINDEX, TROPONINI in the last 168 hours. BNP (last 3 results) No results  for input(s): PROBNP in the last 8760 hours. HbA1C: Recent Labs    12/31/18 0423  HGBA1C 5.7*   CBG: No results for input(s): GLUCAP in the last 168 hours. Lipid Profile: No results for input(s): CHOL, HDL, LDLCALC, TRIG, CHOLHDL, LDLDIRECT in the last 72 hours. Thyroid Function Tests: No results for input(s): TSH, T4TOTAL, FREET4, T3FREE, THYROIDAB in the last 72 hours. Anemia Panel: Recent Labs    12/30/18 1327 12/30/18 1642  FERRITIN  --  4*  TIBC  --  502*  IRON  --  9*  RETICCTPCT 3.5*  --    Sepsis Labs: No results for input(s): PROCALCITON, LATICACIDVEN in the last 168 hours.  No results found for this or any previous visit (from the past 240 hour(s)).       Radiology Studies: Dg Chest 2 View  Result Date: 12/30/2018 CLINICAL DATA:  Pt c/o SOB x 3 months. Seen in Jan. and Dx with bronchitis, initially given inhaler, following month seen again and given Levaquin, seen again end of February and given Z-pack and steroids. Continuing SOB. Followed up with telemed a week ago and this morning. Increasing SOB Pt came to ED. EXAM: CHEST - 2 VIEW COMPARISON:  10/02/2018 FINDINGS: Cardiac silhouette is normal in size. Small hiatal hernia. No mediastinal or hilar masses or evidence of adenopathy. Stable right middle lobe granuloma. Lungs otherwise clear. No pleural effusion or pneumothorax. Skeletal structures are intact. IMPRESSION: No active cardiopulmonary disease. Electronically Signed   By: Lajean Manes M.D.   On: 12/30/2018 13:23   Vas [place] Lower Extremity Venous (dvt)  Result Date: 12/31/2018  Lower Venous Study Indications: Swelling, and Edema.  Performing Technologist: Abram Sander RVS  Examination Guidelines: A complete evaluation includes B-mode imaging, spectral Doppler, color Doppler, and power Doppler as needed of all accessible portions of each vessel. Bilateral testing is considered an integral part of a complete examination. Limited examinations for reoccurring  indications may be performed as noted.  Right Venous Findings: +---------+---------------+---------+-----------+----------+--------------+          CompressibilityPhasicitySpontaneityPropertiesSummary        +---------+---------------+---------+-----------+----------+--------------+ CFV      Full           Yes      Yes                                 +---------+---------------+---------+-----------+----------+--------------+ SFJ      Full                                                        +---------+---------------+---------+-----------+----------+--------------+ FV Prox  Full                                                        +---------+---------------+---------+-----------+----------+--------------+  FV Mid   Full                                                        +---------+---------------+---------+-----------+----------+--------------+ FV DistalFull                                                        +---------+---------------+---------+-----------+----------+--------------+ PFV      Full                                                        +---------+---------------+---------+-----------+----------+--------------+ POP      Full           Yes      Yes                                 +---------+---------------+---------+-----------+----------+--------------+ PTV      None                                         Acute          +---------+---------------+---------+-----------+----------+--------------+ PERO                                                  Not visualized +---------+---------------+---------+-----------+----------+--------------+  Left Venous Findings: +---------+---------------+---------+-----------+----------+-------+          CompressibilityPhasicitySpontaneityPropertiesSummary +---------+---------------+---------+-----------+----------+-------+ CFV      Full           Yes      Yes                           +---------+---------------+---------+-----------+----------+-------+ SFJ      Full                                                 +---------+---------------+---------+-----------+----------+-------+ FV Prox  Full                                                 +---------+---------------+---------+-----------+----------+-------+ FV Mid   Full                                                 +---------+---------------+---------+-----------+----------+-------+ FV DistalFull                                                 +---------+---------------+---------+-----------+----------+-------+  PFV      Full                                                 +---------+---------------+---------+-----------+----------+-------+ POP      Full           Yes      Yes                          +---------+---------------+---------+-----------+----------+-------+ PTV      Full                                                 +---------+---------------+---------+-----------+----------+-------+ PERO     Full                                                 +---------+---------------+---------+-----------+----------+-------+    Summary: Right: Findings consistent with acute deep vein thrombosis involving the right posterior tibial vein. No cystic structure found in the popliteal fossa. Left: There is no evidence of deep vein thrombosis in the lower extremity. No cystic structure found in the popliteal fossa.  *See table(s) above for measurements and observations.    Preliminary         Scheduled Meds: . atenolol  50 mg Oral Daily  . heparin injection (subcutaneous)  5,000 Units Subcutaneous Q8H  . pantoprazole  40 mg Oral Daily  . simvastatin  40 mg Oral Daily  . vitamin B-12  250 mcg Oral Daily  . vitamin C  500 mg Oral Daily   Continuous Infusions:   LOS: 1 day     Cordelia Poche, MD [facility] 12/31/2018, 11:42 AM  If 7PM-7AM, please contact  night-coverage [email or web address]

## 2018-12-31 NOTE — Progress Notes (Signed)
[facility] Gastroenterology Progress Note  MALILLANY KAZLAUSKAS 73 y.o. Jun 12, 1946  CC: Symptomatic anemia   Subjective: Patient seen and examined at bedside.  No acute GI issues.  Multiple questions answered.  Continues to denies blood in the stool or black stool.  ROS : Positive for fatigue and weakness.  Negative for acute chest pain.   Objective: Vital signs in last 24 hours: Vitals:   12/31/18 0035 12/31/18 0523  BP: (!) 157/74 (!) 141/77  Pulse: 76 75  Resp: 16 20  Temp: 99.2 F (37.3 C) 99.1 F (37.3 C)  SpO2: 100% 97%    Physical Exam:  General.  Alert/oriented x3.  Not in acute distress Abdomen.  Soft, nontender, nondistended, bowel sounds present.  No peritoneal signs  Lab Results: Recent Labs    12/30/18 1327 12/31/18 0423  NA 137 139  K 3.7 4.2  CL 103 107  CO2 21* 22  GLUCOSE 179* 94  BUN 22 20  CREATININE 1.47* 0.99  CALCIUM 8.6* 8.8*   No results for input(s): AST, ALT, ALKPHOS, BILITOT, PROT, ALBUMIN in the last 72 hours. Recent Labs    12/30/18 1327 12/31/18 0423  WBC 21.5* 16.9*  NEUTROABS 18.4* 13.4*  HGB 5.1* 7.8*  HCT 18.4* 25.7*  MCV 82.5 82.1  PLT 378 297   No results for input(s): LABPROT, INR in the last 72 hours.    Assessment/Plan: -Symptomatic anemia with hemoglobin of 5.1 on admission.  Stool  occult Blood negative.  No overt bleeding.  Hemoglobin was around 10.4 in January 2020 and was normal in 2018. -Right lower extremity DVT -Chronic kidney disease. -Leukocytosis.  Could be reactive from prednisone use. -History of adenomatous polyp.  Last colonoscopy November 2019 showed no polyp.  Showed diverticulosis and hemorrhoids.  Recommendations --------------------------- -Long discussion with patient regarding diagnosis of right lower extremity DVT in setting of symptomatic iron deficiency anemia.  -Offered EGD but she declined because of low yield of finding acute bleeding in setting of no overt bleeding.  Risk of clot  migration, risk of bleeding while going on anticoagulation and possibility of IVC filter placement discussed.  -She is willing to undergo upper GI, small bowel series to rule out any major pathology  -Hemoglobin improved with blood transfusion.  -Start PPI for possible gastritis although patient does not have any symptoms.  -Keep n.p.o. with sips of water for meds for now.  -GI will follow  Approximately 25 minutes spent in patient encounter with more than 15 minutes spent on face-to-face consultation and coordination of care.   Otis Brace MD, [place] 12/31/2018, 10:20 AM  Contact #  [phone]

## 2018-12-31 NOTE — Progress Notes (Signed)
Orthostatic Blood Pressure Results    12/31/18 1413 12/31/18 1414 12/31/18 1416  Vitals  BP (!) 146/77 (!) 127/99 118/78  MAP (mmHg) 96 108 91  BP Location Right Arm Right Arm Right Arm  BP Method Automatic Automatic Automatic  Patient Position (if appropriate) Lying Sitting Standing  Pulse Rate 70 76 74  Pulse Rate Source Monitor Monitor Monitor    12/31/18 1418  Vitals  BP (!) 106/93  MAP (mmHg) 99  BP Location Right Arm  BP Method Automatic  Patient Position (if appropriate) Standing  Pulse Rate 74  Pulse Rate Source Monitor

## 2018-12-31 NOTE — Progress Notes (Signed)
Lower extremity venous duplex has been completed.   Preliminary results in CV Proc.   Abram Sander 12/31/2018 8:25 AM

## 2019-01-01 ENCOUNTER — Inpatient Hospital Stay (HOSPITAL_COMMUNITY): Payer: Medicare Other

## 2019-01-01 ENCOUNTER — Encounter (HOSPITAL_COMMUNITY): Payer: Self-pay | Admitting: Interventional Radiology

## 2019-01-01 DIAGNOSIS — N179 Acute kidney failure, unspecified: Secondary | ICD-10-CM

## 2019-01-01 DIAGNOSIS — D509 Iron deficiency anemia, unspecified: Secondary | ICD-10-CM

## 2019-01-01 DIAGNOSIS — I82401 Acute embolism and thrombosis of unspecified deep veins of right lower extremity: Secondary | ICD-10-CM

## 2019-01-01 HISTORY — DX: Acute kidney failure, unspecified: N17.9

## 2019-01-01 HISTORY — PX: IR IVC FILTER PLMT / S&I /IMG GUID/MOD SED: IMG701

## 2019-01-01 LAB — CBC
HCT: 26.4 % — ABNORMAL LOW (ref 36.0–46.0)
Hemoglobin: 7.8 g/dL — ABNORMAL LOW (ref 12.0–15.0)
MCH: 25 pg — ABNORMAL LOW (ref 26.0–34.0)
MCHC: 29.5 g/dL — ABNORMAL LOW (ref 30.0–36.0)
MCV: 84.6 fL (ref 80.0–100.0)
Platelets: 288 10*3/uL (ref 150–400)
RBC: 3.12 MIL/uL — ABNORMAL LOW (ref 3.87–5.11)
RDW: 16.7 % — ABNORMAL HIGH (ref 11.5–15.5)
WBC: 14.5 10*3/uL — ABNORMAL HIGH (ref 4.0–10.5)
nRBC: 0.1 % (ref 0.0–0.2)

## 2019-01-01 LAB — PROTIME-INR
INR: 1.1 (ref 0.8–1.2)
Prothrombin Time: 13.8 seconds (ref 11.4–15.2)

## 2019-01-01 MED ORDER — TRAMADOL HCL 50 MG PO TABS: 50.0000 mg | ORAL_TABLET | Freq: Four times a day (QID) | ORAL | Status: DC | PRN

## 2019-01-01 MED ORDER — MIDAZOLAM HCL 2 MG/2ML IJ SOLN
INTRAMUSCULAR | Status: AC
  Filled 2019-01-01: qty 4

## 2019-01-01 MED ORDER — ACETAMINOPHEN 325 MG PO TABS: 650.0000 mg | ORAL_TABLET | Freq: Four times a day (QID) | ORAL | Status: DC | PRN

## 2019-01-01 MED ORDER — MIDAZOLAM HCL 2 MG/2ML IJ SOLN
INTRAMUSCULAR | Status: AC | PRN
  Administered 2019-01-01 (×2): 1 mg via INTRAVENOUS

## 2019-01-01 MED ORDER — SODIUM CHLORIDE 0.9 % IV SOLN
510.0000 mg | Freq: Once | INTRAVENOUS | Status: AC
  Administered 2019-01-01: 510 mg via INTRAVENOUS
  Filled 2019-01-01: qty 17

## 2019-01-01 MED ORDER — LIDOCAINE HCL 1 % IJ SOLN
INTRAMUSCULAR | Status: AC
  Filled 2019-01-01: qty 20

## 2019-01-01 MED ORDER — IOHEXOL 300 MG/ML  SOLN
50.0000 mL | Freq: Once | INTRAMUSCULAR | Status: AC | PRN
  Administered 2019-01-01: 12:00:00 10 mL via INTRAVENOUS

## 2019-01-01 MED ORDER — LIDOCAINE HCL 1 % IJ SOLN
INTRAMUSCULAR | Status: AC | PRN
  Administered 2019-01-01: 5 mL

## 2019-01-01 MED ORDER — FENTANYL CITRATE (PF) 100 MCG/2ML IJ SOLN
INTRAMUSCULAR | Status: AC | PRN
  Administered 2019-01-01 (×2): 50 ug via INTRAVENOUS

## 2019-01-01 MED ORDER — FENTANYL CITRATE (PF) 100 MCG/2ML IJ SOLN
INTRAMUSCULAR | Status: AC
  Filled 2019-01-01: qty 2

## 2019-01-01 MED ORDER — HYDROCODONE-ACETAMINOPHEN 5-325 MG PO TABS: 1.0000 | ORAL_TABLET | Freq: Four times a day (QID) | ORAL | Status: DC | PRN

## 2019-01-01 MED ORDER — IOHEXOL 300 MG/ML  SOLN
100.0000 mL | Freq: Once | INTRAMUSCULAR | Status: AC | PRN
  Administered 2019-01-01: 12:00:00 25 mL via INTRAVENOUS

## 2019-01-01 NOTE — Care Management Important Message (Signed)
Important Message  Patient Details  Name: STARIA BIRKHEAD MRN: [id number] Date of Birth: September 14, 1946   Medicare Important Message Given:  Yes    Kerin Salen 01/01/2019, 11:53 AMImportant Message  Patient Details  Name: YANILEN ADAMIK MRN: [id number] Date of Birth: 10-Mar-1946   Medicare Important Message Given:  Yes    Kerin Salen 01/01/2019, 11:52 AM

## 2019-01-01 NOTE — Evaluation (Signed)
Physical Therapy Evaluation-1x Patient Details Name: Rebecca Chen MRN: [id number] DOB: 1946/08/01 Today's Date: 01/01/2019   History of Present Illness  73 yo female admitted with anemia, R LE DVT. S/P IVC filter 4/3  Clinical Impression  On eval, pt was Supervision level assist for mobility. She walked ~60 feet with a RW. Pain rated 9/10 In R foot/LE with WBing. Pt is guarded, a bit reluctant to participate, and expresses her desire to be discharged home ASAP. Discussed d/c plan-pt lives alone but stated she can get help IF she feels she needs it. Recommended RW use for safe ambulation and pain control however pt is not agreeable. She stated she'll get around with her cane at home. She also declines HHPT f/u. Will sign off.    Follow Up Recommendations No PT follow up;Supervision - Intermittent (Pt declines f/u PT )    Equipment Recommendations  None recommended by PT (Pt declines RW)    Recommendations for Other Services       Precautions / Restrictions Precautions Precautions: Fall Restrictions Weight Bearing Restrictions: No      Mobility  Bed Mobility               General bed mobility comments: oob sitting in window seat  Transfers Overall transfer level: Needs assistance Equipment used: None Transfers: Sit to/from Stand Sit to Stand: Modified independent (Device/Increase time)            Ambulation/Gait Ambulation/Gait assistance: Supervision Gait Distance (Feet): 60 Feet Assistive device: Rolling walker (2 wheeled) Gait Pattern/deviations: Step-to pattern;Antalgic     General Gait Details: VCs safety. Significantly antalgic gait pattern. Distance limited 2* pain.   Stairs            Wheelchair Mobility    Modified Rankin (Stroke Patients Only)       Balance Overall balance assessment: Needs assistance         Standing balance support: No upper extremity supported Standing balance-Leahy Scale: Fair                                Pertinent Vitals/Pain Pain Assessment: 0-10 Pain Score: 9  Pain Location: R LE/foot with WBing Pain Descriptors / Indicators: Tender;Sore;Discomfort;Guarding Pain Intervention(s): Limited activity within patient's tolerance;Repositioned    Home Living Family/patient expects to be discharged to:: Private residence Living Arrangements: Alone   Type of Home: House Home Access: Level entry     Home Layout: Multi-level Home Equipment: Cane - single point      Prior Function Level of Independence: Independent               Hand Dominance        Extremity/Trunk Assessment   Upper Extremity Assessment Upper Extremity Assessment: Overall WFL for tasks assessed    Lower Extremity Assessment Lower Extremity Assessment: RLE deficits/detail RLE Deficits / Details: able to weightbear but very painful    Cervical / Trunk Assessment Cervical / Trunk Assessment: Normal  Communication   Communication: No difficulties  Cognition Arousal/Alertness: Awake/alert Behavior During Therapy: WFL for tasks assessed/performed Overall Cognitive Status: Within Functional Limits for tasks assessed                                 General Comments: pt somewhat guarded and reluctant to provide home info      General Comments  Exercises     Assessment/Plan    PT Assessment Patent does not need any further PT services(pt declines any f/u PT)  PT Problem List         PT Treatment Interventions      PT Goals (Current goals can be found in the Care Plan section)  Acute Rehab PT Goals Patient Stated Goal: to go home! PT Goal Formulation: All assessment and education complete, DC therapy    Frequency     Barriers to discharge        Co-evaluation               AM-PAC PT "6 Clicks" Mobility  Outcome Measure Help needed turning from your back to your side while in a flat bed without using bedrails?: None Help needed moving from lying  on your back to sitting on the side of a flat bed without using bedrails?: None Help needed moving to and from a bed to a chair (including a wheelchair)?: A Little Help needed standing up from a chair using your arms (e.g., wheelchair or bedside chair)?: A Little Help needed to walk in hospital room?: A Little Help needed climbing 3-5 steps with a railing? : A Little 6 Click Score: 20    End of Session   Activity Tolerance: Patient limited by pain Patient left: in chair;with call bell/phone within reach   PT Visit Diagnosis: Pain;Unsteadiness on feet (R26.81);Difficulty in walking, not elsewhere classified (R26.2) Pain - Right/Left: Right Pain - part of body: Ankle and joints of foot;Leg    Time: 0175-1025 PT Time Calculation (min) (ACUTE ONLY): 8 min   Charges:   PT Evaluation $PT Eval Moderate Complexity: [place], PT Acute Rehabilitation Services Pager: [phone] Office: [phone]

## 2019-01-01 NOTE — Progress Notes (Signed)
Patient reports dizziness, increase pain and swelling on her right  lower extremity. Pain medicine was given and patient was instructed to notify staff whenever she gets up, since she insisted to sleep in couch .

## 2019-01-01 NOTE — Progress Notes (Signed)
[facility] Gastroenterology Progress Note  Rebecca Chen 73 y.o. 12-Dec-1945  CC: Symptomatic anemia   Subjective: Patient seen and examined at bedside.  Yesterday's events noted.  After discussing with her family yesterday, she denied upper GI small bowel series.  She will be going for IVC filter placement today.  Complaining of abdominal bloating and discomfort.  Last bowel movement today which was normal.  Denies nausea vomiting.  ROS : Positive for fatigue and weakness.  Negative for acute chest pain.   Objective: Vital signs in last 24 hours: Vitals:   12/31/18 2141 01/01/19 0536  BP: 114/77 (!) 105/56  Pulse: 71 67  Resp: 16 16  Temp: 99 F (37.2 C) 98.6 F (37 C)  SpO2: 100% 95%    Physical Exam:  General.  Alert/oriented x3.  Not in acute distress Abdomen.  Soft, nontender, nondistended, bowel sounds present.  No peritoneal signs  Lab Results: Recent Labs    12/30/18 1327 12/31/18 0423  NA 137 139  K 3.7 4.2  CL 103 107  CO2 21* 22  GLUCOSE 179* 94  BUN 22 20  CREATININE 1.47* 0.99  CALCIUM 8.6* 8.8*   No results for input(s): AST, ALT, ALKPHOS, BILITOT, PROT, ALBUMIN in the last 72 hours. Recent Labs    12/30/18 1327 12/31/18 0423 01/01/19 0406  WBC 21.5* 16.9* 14.5*  NEUTROABS 18.4* 13.4*  --   HGB 5.1* 7.8* 7.8*  HCT 18.4* 25.7* 26.4*  MCV 82.5 82.1 84.6  PLT 378 297 288   Recent Labs    01/01/19 0406  LABPROT 13.8  INR 1.1      Assessment/Plan: -Symptomatic anemia with hemoglobin of 5.1 on admission.  Stool  occult Blood negative.  No overt bleeding.  Hemoglobin was around 10.4 in January 2020 and was normal in 2018. -Right lower extremity DVT -Chronic kidney disease. -Leukocytosis.  Could be reactive from prednisone use. -History of adenomatous polyp.  Last colonoscopy November 2019 showed no polyp.  Showed diverticulosis and hemorrhoids.  Recommendations --------------------------- -Patient continues to decline EGD.  Also  declined upper GI small bowel series.  She is undergoing IVC filter placement today. - Hgb is  stable.  No overt bleeding. - she  will call [facility] office to arrange for follow-up with Dr. Paulita Fujita to discuss outpatient EGD. -GI will sign off.  Call us back if needed   Otis Brace MD, [place] 01/01/2019, 8:31 AM  Contact #  [phone]

## 2019-01-01 NOTE — Discharge Instructions (Signed)
Rebecca Chen,  You were in the hospital because of significant anemia in addition to finding out you had a blood clot in your leg. You were given blood and recommendations were to find out if you possibly had a GI bleed. You declined this workup during this hospitalization, but recommendations are to follow-up with your outpatient GI physician. With regard to your blood clot, you had an IVC filter placed since it is not recommended you start on anticoagulation at this time with unknown cause for your anemia. Please follow-up with your PCP and the interventional radiologists with regard to this issue as your filter will eventually need to be removed.

## 2019-01-01 NOTE — Discharge Summary (Signed)
Physician Discharge Summary  Rebecca Chen SFK:[id number] DOB: [birth year] DOA: 12/30/2018  PCP: Maurice Small, MD  Admit date: 12/30/2018 Discharge date: 01/01/2019  Admitted From: Home Disposition: Home  Recommendations for Outpatient Follow-up:  1. Follow up with PCP in 1 week 2. Follow up with GI for outpatient EGD 3. Please obtain BMP/CBC in one week 4. Please follow up on the following pending results: None  Home Health: None (Patient declined physical therapy services) Equipment/Devices: None (Patient declined rolling walker)  Discharge Condition: Stable CODE STATUS: Full code Diet recommendation: Heart healthy   Brief/Interim Summary:  Admission HPI written by Kayleen Memos, DO   Chief Complaint: Shortness of breath  HPI: Rebecca Chen is a 73 y.o. female with medical history significant for gastric ulcers in her 81s, CKD 3, hypertension, who presented to [facility] ED with complaints of gradually worsening shortness of breath of 3 months duration.  Associated with nonproductive cough and wheezing.  States she was seen by her primary care provider about 2 weeks ago.  Prescribed antibiotics.  First on Levaquin, finished course, then on azithromycin with prednisone with no improvement of symptoms.  Also reporting right lower extremity tenderness on palpation and bilateral lower extremity edema.  Last colonoscopy was in October by Dr. Lanny Cramp with no abnormal findings.  No chest pain or palpitations.  No recent traveling or known exposure to COVID-19.  ED Course: In the ED, vital signs essentially unremarkable.  Lab studies remarkable for significant anemia with hemoglobin of 5.1 with a baseline of 10.  Negative FOBT.  TRH asked to admit.  ED physician will consult GI.   Hospital course:   Symptomatic anemia Baseline hemoglobin of 10.4 from three months ago. Patient is iron deficient. Unknown etiology of anemia. GI consulted. Recent colonoscopy. Complicated by acute DVT. S/p 2  units PRBC with improvement of hemoglobin from 5.1 to 7.8, which has remained stable. Symptoms improved but not resolved. Patient declined EGD or upper GI evaluation although recommended by GI. This was discussed with the patient by both me and the gastroenterologist. IV iron given prior to discharge for iron deficiency (iron of 9, ferritin of 4, TIBC of 502). Discharged with precautions. Declining therapy/equipment options on discharge. She will need outpatient GI follow-up which was discussed with the patient. Also discussed with GI consulting physician who will get in touch with patient's primary gastroenterologist.  Shortness of breath Improved with blood transfusion.  Right acute LE DVT Cannot anticoagulate at this time secondary to possible GI bleeding. Symptomatic. Located in right posterior tibial vein (below knee DVT). Patient not wanting to undergo endoscopy at this time. IVC filter placed successfully. Will need follow-up with interventional radiology as an outpatient. Needs surveillance of DVT.  AKI Unknown baseline. Creatinine of 1.47 on admission. Resolved now.  Leukocytosis Secondary to steroids. Improved.  Hyperglycemia Hemoglobin A1C of 5.7%. Recommend diet control.  Discharge Diagnoses:  Principal Problem:   Symptomatic anemia Active Problems:   Leg DVT (deep venous thromboembolism), acute, right (HCC)   Iron deficiency anemia, unspecified    Discharge Instructions   Allergies as of 01/01/2019   No Known Allergies     Medication List    STOP taking these medications   predniSONE 20 MG tablet Commonly known as:  DELTASONE     TAKE these medications   atenolol 50 MG tablet Commonly known as:  TENORMIN Take 50 mg by mouth daily.   CALCIUM PO Take 1 tablet by mouth daily.   CRANBERRY  PO Take 1 tablet by mouth daily.   NIACIN PO Take 1 tablet by mouth daily.   simvastatin 40 MG tablet Commonly known as:  ZOCOR Take 40 mg by mouth daily.    VITAMIN B-12 PO Take 1 tablet by mouth daily.   VITAMIN B-6 PO Take 1 tablet by mouth daily.   VITAMIN C PO Take 1 tablet by mouth daily.   VITAMIN D PO Take 1 tablet by mouth daily.   VITAMIN E PO Take 1 tablet by mouth daily.   zolpidem 10 MG tablet Commonly known as:  AMBIEN Take 10 mg by mouth at bedtime as needed for sleep.       No Known Allergies  Consultations:  [facility]   Procedures/Studies: Dg Chest 2 View  Result Date: 12/30/2018 CLINICAL DATA:  Pt c/o SOB x 3 months. Seen in Jan. and Dx with bronchitis, initially given inhaler, following month seen again and given Levaquin, seen again end of February and given Z-pack and steroids. Continuing SOB. Followed up with telemed a week ago and this morning. Increasing SOB Pt came to ED. EXAM: CHEST - 2 VIEW COMPARISON:  10/02/2018 FINDINGS: Cardiac silhouette is normal in size. Small hiatal hernia. No mediastinal or hilar masses or evidence of adenopathy. Stable right middle lobe granuloma. Lungs otherwise clear. No pleural effusion or pneumothorax. Skeletal structures are intact. IMPRESSION: No active cardiopulmonary disease. Electronically Signed   By: Lajean Manes M.D.   On: 12/30/2018 13:23   Vas [place] Lower Extremity Venous (dvt)  Result Date: 12/31/2018  Lower Venous Study Indications: Swelling, and Edema.  Performing Technologist: Abram Sander RVS  Examination Guidelines: A complete evaluation includes B-mode imaging, spectral Doppler, color Doppler, and power Doppler as needed of all accessible portions of each vessel. Bilateral testing is considered an integral part of a complete examination. Limited examinations for reoccurring indications may be performed as noted.  Right Venous Findings: +---------+---------------+---------+-----------+----------+--------------+          CompressibilityPhasicitySpontaneityPropertiesSummary        +---------+---------------+---------+-----------+----------+--------------+ CFV       Full           Yes      Yes                                 +---------+---------------+---------+-----------+----------+--------------+ SFJ      Full                                                        +---------+---------------+---------+-----------+----------+--------------+ FV Prox  Full                                                        +---------+---------------+---------+-----------+----------+--------------+ FV Mid   Full                                                        +---------+---------------+---------+-----------+----------+--------------+ FV DistalFull                                                        +---------+---------------+---------+-----------+----------+--------------+  PFV      Full                                                        +---------+---------------+---------+-----------+----------+--------------+ POP      Full           Yes      Yes                                 +---------+---------------+---------+-----------+----------+--------------+ PTV      None                                         Acute          +---------+---------------+---------+-----------+----------+--------------+ PERO                                                  Not visualized +---------+---------------+---------+-----------+----------+--------------+  Left Venous Findings: +---------+---------------+---------+-----------+----------+-------+          CompressibilityPhasicitySpontaneityPropertiesSummary +---------+---------------+---------+-----------+----------+-------+ CFV      Full           Yes      Yes                          +---------+---------------+---------+-----------+----------+-------+ SFJ      Full                                                 +---------+---------------+---------+-----------+----------+-------+ FV Prox  Full                                                  +---------+---------------+---------+-----------+----------+-------+ FV Mid   Full                                                 +---------+---------------+---------+-----------+----------+-------+ FV DistalFull                                                 +---------+---------------+---------+-----------+----------+-------+ PFV      Full                                                 +---------+---------------+---------+-----------+----------+-------+ POP      Full           Yes      Yes                          +---------+---------------+---------+-----------+----------+-------+  PTV      Full                                                 +---------+---------------+---------+-----------+----------+-------+ PERO     Full                                                 +---------+---------------+---------+-----------+----------+-------+    Summary: Right: Findings consistent with acute deep vein thrombosis involving the right posterior tibial vein. No cystic structure found in the popliteal fossa. Left: There is no evidence of deep vein thrombosis in the lower extremity. No cystic structure found in the popliteal fossa.  *See table(s) above for measurements and observations. Electronically signed by Harold Barban MD on 12/31/2018 at 4:09:33 PM.    Final        Subjective: Pain with her right leg  Discharge Exam: Vitals:   01/01/19 1336 01/01/19 1339 01/01/19 1534 01/01/19 1613  BP: (!) 112/96 119/65 (!) 127/59 (!) 105/55  Pulse: 76 80 84 75  Resp:   20 18  Temp:   98.8 F (37.1 C) 98.5 F (36.9 C)  TempSrc:   Oral Oral  SpO2: 100% 100% 100% 100%  Weight:      Height:        General: Pt is alert, awake, not in acute distress Cardiovascular: RRR, S1/S2 +, no rubs, no gallops Respiratory: CTA bilaterally, no wheezing, no rhonchi Abdominal: Soft, NT, ND, bowel sounds + Extremities: no edema, no cyanosis    The results of significant  diagnostics from this hospitalization (including imaging, microbiology, ancillary and laboratory) are listed below for reference.     Microbiology: No results found for this or any previous visit (from the past 240 hour(s)).   Labs: BNP (last 3 results) Recent Labs    12/31/18 0423  BNP 95.2   Basic Metabolic Panel: Recent Labs  Lab 12/30/18 1327 12/31/18 0423  NA 137 139  K 3.7 4.2  CL 103 107  CO2 21* 22  GLUCOSE 179* 94  BUN 22 20  CREATININE 1.47* 0.99  CALCIUM 8.6* 8.8*   CBC: Recent Labs  Lab 12/30/18 1327 12/31/18 0423 01/01/19 0406  WBC 21.5* 16.9* 14.5*  NEUTROABS 18.4* 13.4*  --   HGB 5.1* 7.8* 7.8*  HCT 18.4* 25.7* 26.4*  MCV 82.5 82.1 84.6  PLT 378 297 288   Hgb A1c Recent Labs    12/31/18 0423  HGBA1C 5.7*   Anemia work up Recent Labs    12/30/18 1327 12/30/18 1642  FERRITIN  --  4*  TIBC  --  502*  IRON  --  9*  RETICCTPCT 3.5*  --     SIGNED:   Cordelia Poche, MD [facility] 01/01/2019, 10:38 AM

## 2019-01-05 DIAGNOSIS — I82409 Acute embolism and thrombosis of unspecified deep veins of unspecified lower extremity: Secondary | ICD-10-CM | POA: Diagnosis not present

## 2019-01-05 DIAGNOSIS — D649 Anemia, unspecified: Secondary | ICD-10-CM | POA: Diagnosis not present

## 2019-01-07 ENCOUNTER — Ambulatory Visit: Payer: Self-pay | Admitting: Cardiology

## 2019-01-20 DIAGNOSIS — D649 Anemia, unspecified: Secondary | ICD-10-CM | POA: Diagnosis not present

## 2019-02-08 DIAGNOSIS — E785 Hyperlipidemia, unspecified: Secondary | ICD-10-CM | POA: Diagnosis not present

## 2019-03-04 ENCOUNTER — Ambulatory Visit: Payer: Self-pay | Admitting: Interventional Cardiology

## 2019-03-05 DIAGNOSIS — D500 Iron deficiency anemia secondary to blood loss (chronic): Secondary | ICD-10-CM | POA: Diagnosis not present

## 2019-03-05 DIAGNOSIS — K259 Gastric ulcer, unspecified as acute or chronic, without hemorrhage or perforation: Secondary | ICD-10-CM | POA: Diagnosis not present

## 2019-03-05 DIAGNOSIS — K449 Diaphragmatic hernia without obstruction or gangrene: Secondary | ICD-10-CM | POA: Diagnosis not present

## 2019-03-09 DIAGNOSIS — K259 Gastric ulcer, unspecified as acute or chronic, without hemorrhage or perforation: Secondary | ICD-10-CM | POA: Diagnosis not present

## 2019-03-09 DIAGNOSIS — D500 Iron deficiency anemia secondary to blood loss (chronic): Secondary | ICD-10-CM | POA: Diagnosis not present

## 2019-04-13 DIAGNOSIS — I872 Venous insufficiency (chronic) (peripheral): Secondary | ICD-10-CM | POA: Diagnosis not present

## 2019-04-13 DIAGNOSIS — D1801 Hemangioma of skin and subcutaneous tissue: Secondary | ICD-10-CM | POA: Diagnosis not present

## 2019-04-13 DIAGNOSIS — C44311 Basal cell carcinoma of skin of nose: Secondary | ICD-10-CM | POA: Diagnosis not present

## 2019-04-13 DIAGNOSIS — Z411 Encounter for cosmetic surgery: Secondary | ICD-10-CM | POA: Diagnosis not present

## 2019-04-13 DIAGNOSIS — D485 Neoplasm of uncertain behavior of skin: Secondary | ICD-10-CM | POA: Diagnosis not present

## 2019-04-13 DIAGNOSIS — L814 Other melanin hyperpigmentation: Secondary | ICD-10-CM | POA: Diagnosis not present

## 2019-04-13 DIAGNOSIS — D225 Melanocytic nevi of trunk: Secondary | ICD-10-CM | POA: Diagnosis not present

## 2019-04-13 DIAGNOSIS — L821 Other seborrheic keratosis: Secondary | ICD-10-CM | POA: Diagnosis not present

## 2019-04-27 DIAGNOSIS — D649 Anemia, unspecified: Secondary | ICD-10-CM | POA: Diagnosis not present

## 2019-05-17 ENCOUNTER — Other Ambulatory Visit: Payer: Self-pay | Admitting: Interventional Radiology

## 2019-05-17 ENCOUNTER — Other Ambulatory Visit: Payer: Self-pay | Admitting: Radiology

## 2019-05-17 DIAGNOSIS — Z95828 Presence of other vascular implants and grafts: Secondary | ICD-10-CM

## 2019-05-20 ENCOUNTER — Other Ambulatory Visit: Payer: Self-pay | Admitting: Family Medicine

## 2019-05-20 DIAGNOSIS — Z1231 Encounter for screening mammogram for malignant neoplasm of breast: Secondary | ICD-10-CM

## 2019-06-24 DIAGNOSIS — C44311 Basal cell carcinoma of skin of nose: Secondary | ICD-10-CM | POA: Diagnosis not present

## 2019-07-02 ENCOUNTER — Ambulatory Visit
Admission: RE | Admit: 2019-07-02 | Discharge: 2019-07-02 | Disposition: A | Discharge status: Home / Self Care | Payer: Medicare Other | Source: Ambulatory Visit | Attending: Family Medicine | Admitting: Family Medicine

## 2019-07-02 ENCOUNTER — Other Ambulatory Visit: Payer: Self-pay | Admitting: Interventional Radiology

## 2019-07-02 ENCOUNTER — Other Ambulatory Visit: Payer: Self-pay

## 2019-07-02 DIAGNOSIS — Z1231 Encounter for screening mammogram for malignant neoplasm of breast: Secondary | ICD-10-CM | POA: Diagnosis not present

## 2019-07-02 DIAGNOSIS — Z95828 Presence of other vascular implants and grafts: Secondary | ICD-10-CM

## 2019-07-13 DIAGNOSIS — I10 Essential (primary) hypertension: Secondary | ICD-10-CM | POA: Diagnosis not present

## 2019-07-13 DIAGNOSIS — Z0000 Encounter for general adult medical examination without abnormal findings: Secondary | ICD-10-CM | POA: Diagnosis not present

## 2019-07-13 DIAGNOSIS — E785 Hyperlipidemia, unspecified: Secondary | ICD-10-CM | POA: Diagnosis not present

## 2019-07-13 DIAGNOSIS — Z862 Personal history of diseases of the blood and blood-forming organs and certain disorders involving the immune mechanism: Secondary | ICD-10-CM | POA: Diagnosis not present

## 2019-07-13 DIAGNOSIS — E559 Vitamin D deficiency, unspecified: Secondary | ICD-10-CM | POA: Diagnosis not present

## 2019-07-21 ENCOUNTER — Ambulatory Visit
Admission: RE | Admit: 2019-07-21 | Discharge: 2019-07-21 | Disposition: A | Discharge status: Home / Self Care | Payer: Medicare Other | Source: Ambulatory Visit | Attending: Interventional Radiology | Admitting: Interventional Radiology

## 2019-07-21 ENCOUNTER — Encounter: Payer: Self-pay | Admitting: *Deleted

## 2019-07-21 DIAGNOSIS — Z95828 Presence of other vascular implants and grafts: Secondary | ICD-10-CM

## 2019-07-21 DIAGNOSIS — I82441 Acute embolism and thrombosis of right tibial vein: Secondary | ICD-10-CM | POA: Diagnosis not present

## 2019-07-21 HISTORY — PX: IR RADIOLOGIST EVAL & MGMT: IMG5224

## 2019-07-21 NOTE — Progress Notes (Signed)
Chief Complaint: Follow-up after IVC filter placement.  History of Present Illness: Rebecca Chen is a 73 y.o. female status post IVC filter placement on 01/01/2019 while an inpatient due to development of a right posterior tibial vein DVT and significant anemia at that time with hemoglobin of 5.1.  Since that time she has been doing well out of the hospital and was diagnosed with significant gastritis.  She is followed by Dr. Paulita Fujita.  Hemoglobin is now 12.8.  She denies any lower extremity edema or pain.  She has not had any abdominal pain.  Other than the isolated right posterior tibial vein DVT, she has no other prior history of DVT, pulmonary embolism, hematologic disease or malignancy.  She has never been anticoagulated in the past.  Past Medical History:  Diagnosis Date  . AKI (acute kidney injury) ([place]) 01/01/2019  . Hypertension     Past Surgical History:  Procedure Laterality Date  . FOOT SURGERY    . IR IVC FILTER PLMT / S&I /IMG GUID/MOD SED  01/01/2019  . IR RADIOLOGIST EVAL & MGMT  07/21/2019    Allergies: Patient has no known allergies.  Medications: Prior to Admission medications   Medication Sig Start Date End Date Taking? Authorizing Provider  Ascorbic Acid (VITAMIN C PO) Take 1 tablet by mouth daily.    [provider]  atenolol (TENORMIN) 50 MG tablet Take 50 mg by mouth daily.    [provider]  CALCIUM PO Take 1 tablet by mouth daily.    [provider]  CRANBERRY PO Take 1 tablet by mouth daily.    [provider]  Cyanocobalamin (VITAMIN B-12 PO) Take 1 tablet by mouth daily.    [provider]  NIACIN PO Take 1 tablet by mouth daily.    [provider]  Pyridoxine HCl (VITAMIN B-6 PO) Take 1 tablet by mouth daily.    [provider]  simvastatin (ZOCOR) 40 MG tablet Take 40 mg by mouth daily.    [provider]  VITAMIN D PO Take 1 tablet by mouth daily.    [provider]  VITAMIN E PO Take 1 tablet by mouth daily.    [provider]  zolpidem (AMBIEN) 10 MG tablet Take 10 mg by mouth at bedtime as needed for sleep.  12/15/18   [provider]     No family history on file.  Social History   Socioeconomic History  . Marital status: Single    Spouse name: Not on file  . Number of children: Not on file  . Years of education: Not on file  . Highest education level: Not on file  Occupational History  . Not on file  Social Needs  . Financial resource strain: Not on file  . Food insecurity    Worry: Not on file    Inability: Not on file  . Transportation needs    Medical: Not on file    Non-medical: Not on file  Tobacco Use  . Smoking status: Never Smoker  . Smokeless tobacco: Never Used  Substance and Sexual Activity  . Alcohol use: Not on file  . Drug use: Not on file  . Sexual activity: Not on file  Lifestyle  . Physical activity    Days per week: Not on file    Minutes per session: Not on file  . Stress: Not on file  Relationships  . Social connections    Talks on phone: Not on  file    Gets together: Not on file    Attends religious service: Not on file    Active member of club or organization: Not on file    Attends meetings of clubs or organizations: Not on file    Relationship status: Not on file  Other Topics Concern  . Not on file  Social History Narrative  . Not on file     Review of Systems: A 12 point ROS discussed and pertinent positives are indicated in the HPI above.  All other systems are negative.  Review of Systems  Constitutional: Negative.   Respiratory: Negative.   Cardiovascular: Negative.   Gastrointestinal: Negative.   Genitourinary: Negative.   Musculoskeletal: Negative.   Neurological: Negative.     Vital Signs: BP (!) 177/81 (BP Location: Right Arm)   Pulse 65   Temp 97.9 F (36.6 C)   SpO2 98%   Physical Exam Vitals signs reviewed.  Constitutional:      General: She is  not in acute distress.    Appearance: Normal appearance. She is not ill-appearing, toxic-appearing or diaphoretic.  Musculoskeletal:        General: No swelling or tenderness.     Right lower leg: No edema.     Left lower leg: No edema.  Skin:    General: Skin is warm and dry.  Neurological:     General: No focal deficit present.     Mental Status: She is alert and oriented to person, place, and time.      Imaging: US Venous Img Lower Bilateral  Result Date: 07/21/2019 CLINICAL DATA:  Status post IVC filter placement on 01/01/2019 due to acute right posterior tibial vein DVT and significant anemia at that time with contraindication to anticoagulation. Status of DVT is now check by duplex ultrasound prior to potential IVC filter retrieval. EXAM: BILATERAL LOWER EXTREMITY VENOUS DOPPLER ULTRASOUND TECHNIQUE: Gray-scale sonography with graded compression, as well as color Doppler and duplex ultrasound were performed to evaluate the lower extremity deep venous systems from the level of the common femoral vein and including the common femoral, femoral, profunda femoral, popliteal and calf veins including the posterior tibial, peroneal and gastrocnemius veins when visible. The superficial great saphenous vein was also interrogated. Spectral Doppler was utilized to evaluate flow at rest and with distal augmentation maneuvers in the common femoral, femoral and popliteal veins. COMPARISON:  Report from a prior study performed at [facility] on 12/31/2018 FINDINGS: RIGHT LOWER EXTREMITY Common Femoral Vein: No evidence of thrombus. Normal compressibility, respiratory phasicity and response to augmentation. Saphenofemoral Junction: No evidence of thrombus. Normal compressibility and flow on color Doppler imaging. Profunda Femoral Vein: No evidence of thrombus. Normal compressibility and flow on color Doppler imaging. Femoral Vein: No evidence of thrombus. Normal compressibility, respiratory  phasicity and response to augmentation. Popliteal Vein: No evidence of thrombus. Normal compressibility, respiratory phasicity and response to augmentation. Calf Veins: No evidence of thrombus. Normal compressibility and flow on color Doppler imaging. Superficial Great Saphenous Vein: No evidence of thrombus. Normal compressibility. Venous Reflux:  None. Other Findings: No evidence of superficial thrombophlebitis or abnormal fluid collection. LEFT LOWER EXTREMITY Common Femoral Vein: No evidence of thrombus. Normal compressibility, respiratory phasicity and response to augmentation. Saphenofemoral Junction: No evidence of thrombus. Normal compressibility and flow on color Doppler imaging. Profunda Femoral Vein: No evidence of thrombus. Normal compressibility and flow on color Doppler imaging. Femoral Vein: No evidence of thrombus. Normal compressibility, respiratory phasicity and response to augmentation. Popliteal  Vein: No evidence of thrombus. Normal compressibility, respiratory phasicity and response to augmentation. Calf Veins: No evidence of thrombus. Normal compressibility and flow on color Doppler imaging. Superficial Great Saphenous Vein: No evidence of thrombus. Normal compressibility. Venous Reflux:  None. Other Findings: No evidence of superficial thrombophlebitis or abnormal fluid collection. IMPRESSION: No evidence of deep venous thrombosis in either lower extremity. Previously noted right posterior tibial vein DVT appears completely resolved with no residual chronic thrombus identified. Electronically Signed   By: Aletta Edouard M.D.   On: 07/21/2019 12:09   Mm 3d Screen Breast Bilateral  Result Date: 07/05/2019 CLINICAL DATA:  Screening. EXAM: DIGITAL SCREENING BILATERAL MAMMOGRAM WITH TOMO AND CAD COMPARISON:  Previous exam(s). ACR Breast Density Category c: The breast tissue is heterogeneously dense, which may obscure small masses. FINDINGS: There are no findings suspicious for malignancy.  Images were processed with CAD. IMPRESSION: No mammographic evidence of malignancy. A result letter of this screening mammogram will be mailed directly to the patient. RECOMMENDATION: Screening mammogram in one year. (Code:SM-B-01Y) BI-RADS CATEGORY  1: Negative. Electronically Signed   By: Kristopher Oppenheim M.D.   On: 07/05/2019 11:10   Ir Radiologist Eval & Mgmt  Result Date: 07/21/2019 Please refer to notes tab for details about interventional procedure. (Op Note)   Labs:  CBC: Recent Labs    10/02/18 1213 12/30/18 1327 12/31/18 0423 01/01/19 0406  WBC 7.4 21.5* 16.9* 14.5*  HGB 10.4* 5.1* 7.8* 7.8*  HCT 33.6* 18.4* 25.7* 26.4*  PLT 358 378 297 288    COAGS: Recent Labs    01/01/19 0406  INR 1.1    BMP: Recent Labs    10/02/18 1213 12/30/18 1327 12/31/18 0423  NA 139 137 139  K 4.4 3.7 4.2  CL 107 103 107  CO2 25 21* 22  GLUCOSE 102* 179* 94  BUN 15 22 20   CALCIUM 9.4 8.6* 8.8*  CREATININE 1.31* 1.47* 0.99  GFRNONAA 41* 36* 58*  GFRAA 48* 41* >60     Assessment and Plan:  Venous duplex ultrasound of the lower extremities today demonstrates no evidence of DVT or chronic thrombus.  Right posterior tibial vein thrombus has completely resolved.  Lower extremities are normal on exam without evidence of edema.  I discussed retrieval of the IVC filter with Ms. Wanko.  At this point there is no further indication for an IVC filter and she has no prior history of DVT or venous thromboembolic disease to suggest chronic need for an IVC filter.  After discussion, Ms. Meddaugh would like to proceed with IVC filter retrieval.  I discussed technical details of the procedure with her.  We discussed risks of filter retrieval as well as risks of leaving the filter in as a permanent device.  We will schedule the procedure as an outpatient at either [facility] or [facility].  Electronically Signed: Azzie Roup 07/21/2019, 1:29 PM     I spent a total of 15 Minutes in face  to face in clinical consultation, greater than 50% of which was counseling/coordinating care for IVC filter retrieval.

## 2019-07-22 ENCOUNTER — Other Ambulatory Visit (HOSPITAL_COMMUNITY): Payer: Self-pay | Admitting: Interventional Radiology

## 2019-07-22 DIAGNOSIS — Z95828 Presence of other vascular implants and grafts: Secondary | ICD-10-CM

## 2019-07-22 DIAGNOSIS — Z86718 Personal history of other venous thrombosis and embolism: Secondary | ICD-10-CM

## 2019-08-06 ENCOUNTER — Other Ambulatory Visit: Payer: Self-pay | Admitting: Physician Assistant

## 2019-08-09 ENCOUNTER — Ambulatory Visit (HOSPITAL_COMMUNITY)
Admission: RE | Admit: 2019-08-09 | Discharge: 2019-08-09 | Disposition: A | Discharge status: Home / Self Care | Payer: Medicare Other | Source: Ambulatory Visit | Attending: Interventional Radiology | Admitting: Interventional Radiology

## 2019-08-09 ENCOUNTER — Ambulatory Visit (HOSPITAL_COMMUNITY)
Admission: RE | Admit: 2019-08-09 | Discharge: 2019-08-09 | Disposition: A | Discharge status: Home / Self Care | Payer: Medicare Other | Source: Ambulatory Visit

## 2019-08-09 ENCOUNTER — Encounter (HOSPITAL_COMMUNITY): Payer: Self-pay

## 2019-08-09 ENCOUNTER — Other Ambulatory Visit: Payer: Self-pay

## 2019-08-09 DIAGNOSIS — Z452 Encounter for adjustment and management of vascular access device: Secondary | ICD-10-CM | POA: Diagnosis not present

## 2019-08-09 DIAGNOSIS — Z86718 Personal history of other venous thrombosis and embolism: Secondary | ICD-10-CM | POA: Diagnosis not present

## 2019-08-09 DIAGNOSIS — Z95828 Presence of other vascular implants and grafts: Secondary | ICD-10-CM

## 2019-08-09 DIAGNOSIS — I10 Essential (primary) hypertension: Secondary | ICD-10-CM | POA: Diagnosis not present

## 2019-08-09 DIAGNOSIS — Z79899 Other long term (current) drug therapy: Secondary | ICD-10-CM | POA: Insufficient documentation

## 2019-08-09 HISTORY — PX: IR IVC FILTER RETRIEVAL / S&I /IMG GUID/MOD SED: IMG5308

## 2019-08-09 LAB — CBC
HCT: 41.2 % (ref 36.0–46.0)
Hemoglobin: 13 g/dL (ref 12.0–15.0)
MCH: 30 pg (ref 26.0–34.0)
MCHC: 31.6 g/dL (ref 30.0–36.0)
MCV: 95.2 fL (ref 80.0–100.0)
Platelets: 243 10*3/uL (ref 150–400)
RBC: 4.33 MIL/uL (ref 3.87–5.11)
RDW: 13.9 % (ref 11.5–15.5)
WBC: 6.8 10*3/uL (ref 4.0–10.5)
nRBC: 0 % (ref 0.0–0.2)

## 2019-08-09 LAB — BASIC METABOLIC PANEL
Anion gap: 9 (ref 5–15)
BUN: 26 mg/dL — ABNORMAL HIGH (ref 8–23)
CO2: 21 mmol/L — ABNORMAL LOW (ref 22–32)
Calcium: 9.1 mg/dL (ref 8.9–10.3)
Chloride: 111 mmol/L (ref 98–111)
Creatinine, Ser: 1.01 mg/dL — ABNORMAL HIGH (ref 0.44–1.00)
GFR calc Af Amer: >60 mL/min (ref >60)
GFR calc non Af Amer: 56 mL/min — ABNORMAL LOW (ref >60)
Glucose, Bld: 100 mg/dL — ABNORMAL HIGH (ref 70–99)
Potassium: 4.1 mmol/L (ref 3.5–5.1)
Sodium: 141 mmol/L (ref 135–145)

## 2019-08-09 LAB — PROTIME-INR
INR: 1 (ref 0.8–1.2)
Prothrombin Time: 13 seconds (ref 11.4–15.2)

## 2019-08-09 MED ORDER — MIDAZOLAM HCL 2 MG/2ML IJ SOLN
INTRAMUSCULAR | Status: AC
  Filled 2019-08-09: qty 4

## 2019-08-09 MED ORDER — SODIUM CHLORIDE 0.9 % IV SOLN
INTRAVENOUS | Status: DC
  Administered 2019-08-09: 13:00:00 via INTRAVENOUS

## 2019-08-09 MED ORDER — SODIUM CHLORIDE 0.9 % IV SOLN: INTRAVENOUS | Status: DC

## 2019-08-09 MED ORDER — MIDAZOLAM HCL 2 MG/2ML IJ SOLN
INTRAMUSCULAR | Status: AC | PRN
  Administered 2019-08-09 (×3): 1 mg via INTRAVENOUS

## 2019-08-09 MED ORDER — FENTANYL CITRATE (PF) 100 MCG/2ML IJ SOLN
INTRAMUSCULAR | Status: AC
  Filled 2019-08-09: qty 2

## 2019-08-09 MED ORDER — FENTANYL CITRATE (PF) 100 MCG/2ML IJ SOLN
INTRAMUSCULAR | Status: AC | PRN
  Administered 2019-08-09 (×2): 50 ug via INTRAVENOUS

## 2019-08-09 MED ORDER — LIDOCAINE HCL 1 % IJ SOLN
INTRAMUSCULAR | Status: AC
  Filled 2019-08-09: qty 20

## 2019-08-09 NOTE — H&P (Signed)
Referring Physician(s): Beckie Salts  Supervising Physician: Aletta Edouard  Patient Status:  [facility]  Chief Complaint: "I'm getting my filter out"  Subjective: Patient familiar to IR service from IVC filter placement on 01/01/2019.  She had a history of a right posterior tibial vein DVT with associated significant anemia/gastritis at the time of filter placement.  She was seen in follow-up by Dr. Kathlene Cote on 07/21/2019 with negative venous duplex ultrasound of lower extremities.  Hemoglobin currently normal.  She was deemed an appropriate candidate for IVC filter retrieval and presents today for the procedure.  She currently denies fever, headache, chest pain, dyspnea, cough, abdominal/back pain, nausea, vomiting or bleeding.  Past Medical History:  Diagnosis Date  . AKI (acute kidney injury) ([place]) 01/01/2019  . Hypertension    Past Surgical History:  Procedure Laterality Date  . FOOT SURGERY    . IR IVC FILTER PLMT / S&I /IMG GUID/MOD SED  01/01/2019  . IR RADIOLOGIST EVAL & MGMT  07/21/2019      Allergies: Patient has no known allergies.  Medications: Prior to Admission medications   Medication Sig Start Date End Date Taking? Authorizing Provider  Ascorbic Acid (VITAMIN C PO) Take 1 tablet by mouth daily.   Yes [provider]  atenolol (TENORMIN) 50 MG tablet Take 50 mg by mouth daily.   Yes [provider]  CALCIUM PO Take 1 tablet by mouth daily.   Yes [provider]  CRANBERRY PO Take 1 tablet by mouth daily.   Yes [provider]  Cyanocobalamin (VITAMIN B-12 PO) Take 1 tablet by mouth daily.   Yes [provider]  NIACIN PO Take 1 tablet by mouth daily.   Yes [provider]  Pyridoxine HCl (VITAMIN B-6 PO) Take 1 tablet by mouth daily.   Yes [provider]  simvastatin (ZOCOR) 40 MG tablet Take 40 mg by mouth daily.   Yes [provider]  VITAMIN D PO Take 1 tablet by mouth daily.   Yes [provider]  VITAMIN E PO Take 1 tablet by mouth daily.   Yes [provider]  zolpidem (AMBIEN) 10 MG tablet Take 10 mg by mouth at bedtime as needed for sleep.  12/15/18   [provider]     Vital Signs: Blood pressure 142/83, heart rate 70, temp 98.2, respirations 16, O2 sat 99% room air Ht 5\' 7"  (1.702 m)   Wt 175 lb (79.4 kg)   BMI 27.41 kg/m   Physical Exam awake, alert.  Chest with few fine right basilar crackles, left clear.  Heart with regular rate and rhythm.  Abdomen soft, positive bowel sounds, nontender.  Trace pretibial edema bilaterally.  Imaging: No results found.  Labs:  CBC: Recent Labs    12/30/18 1327 12/31/18 0423 01/01/19 0406 08/09/19 1230  WBC 21.5* 16.9* 14.5* 6.8  HGB 5.1* 7.8* 7.8* 13.0  HCT 18.4* 25.7* 26.4* 41.2  PLT 378 297 288 243    COAGS: Recent Labs    01/01/19 0406  INR 1.1    BMP: Recent Labs    10/02/18 1213 12/30/18 1327 12/31/18 0423  NA 139 137 139  K 4.4 3.7 4.2  CL 107 103 107  CO2 25 21* 22  GLUCOSE 102* 179* 94  BUN 15 22 20   CALCIUM 9.4 8.6* 8.8*  CREATININE 1.31* 1.47* 0.99  GFRNONAA 41* 36* 58*  GFRAA 48* 41* >60    LIVER FUNCTION TESTS: No results for input(s): BILITOT, AST,  ALT, ALKPHOS, PROT, ALBUMIN in the last 8760 hours.  Assessment and Plan: Patient with prior history of right posterior tibial vein DVT, significant anemia related to gastritis in April of this year; status post placement of IVC filter on 01/01/2019; now with normal hemoglobin and negative lower extremity venous Doppler study.  She was seen in consultation by Dr. Kathlene Cote on 07/21/2019 and deemed an appropriate candidate for IVC filter retrieval.  She presents today for the procedure.  Details/risks of procedure, including but not limited to, internal bleeding, infection, injury to adjacent structures, inability to remove filter discussed with patient with her understanding and consent. Creat 1.01 today.    Electronically Signed: D. Rowe Robert, PA-C 08/09/2019, 12:46 PM   I spent a total of 20 minutes at the the patient's bedside AND on the patient's hospital floor or unit, greater than 50% of which was counseling/coordinating care for IVC filter retrieval

## 2019-08-09 NOTE — Procedures (Signed)
Interventional Radiology Procedure Note  Procedure: IVC filter retrieval  Complications: None  Estimated Blood Loss: < 10 mL  Access: Right IJ vein  Findings: IVC filter and IVC normally patent. No thrombus in filter. Filter able to be completely removed utilizing coaxial filter retrieval set.   Venetia Night. Kathlene Cote, M.D Pager:  [phone]

## 2019-08-09 NOTE — Discharge Instructions (Signed)
Inferior Vena Cava Filter Removal, Care After °This sheet gives you information about how to care for yourself after your procedure. Your health care provider may also give you more specific instructions. If you have problems or questions, contact your health care provider. °What can I expect after the procedure? °After the procedure, it is common to have: °· Mild pain and bruising around your incision in your neck or groin. °· Fatigue. °Follow these instructions at home: °Incision care ° °· Follow instructions from your health care provider about how to take care of your incision. Make sure you: °? Wash your hands with soap and water before you change your bandage (dressing). If soap and water are not available, use hand sanitizer. °? Change your dressing as told by your health care provider. °· Check your incision area every day for signs of infection. Check for: °? Redness, swelling, or more pain. °? Fluid or blood. °? Warmth. °? Pus or a bad smell. °General instructions °· Take over-the-counter and prescription medicines only as told by your health care provider. °· Do not take baths, swim, or use a hot tub until your health care provider approves. Ask your health care provider if you may take showers. You may only be allowed to take sponge baths. °· Do not drive for 24 hours if you were given a medicine to help you relax (sedative) during your procedure. °· Return to your normal activities as told by your health care provider. Ask your health care provider what activities are safe for you. °· Keep all follow-up visits as told by your health care provider. This is important. °Contact a health care provider if: °· You have chills or a fever. °· You have redness, swelling, or more pain around your incision. °· Your incision feels warm to the touch. °· You have pus or a bad smell coming from your incision. °Get help right away if: °· You have blood coming from your incision (active bleeding). °? If you have  bleeding from the incision site, lie down, apply pressure to the area with a clean cloth or gauze, and get help right away. °· You have chest pain. °· You have difficulty breathing. °Summary °· Follow instructions from your health care provider about how to take care of your incision. °· Return to your normal activities as told by your health care provider. °· Check your incision area every day for signs of infection. °· Get help right away if you have active bleeding, chest pain, or trouble breathing. °This information is not intended to replace advice given to you by your health care provider. Make sure you discuss any questions you have with your health care provider. °Document Released: 03/27/2017 Document Revised: 08/29/2017 Document Reviewed: 03/27/2017 °Elsevier Patient Education © 2020 [employer] ° ° ° °Moderate Conscious Sedation, Adult, Care After °These instructions provide you with information about caring for yourself after your procedure. Your health care provider may also give you more specific instructions. Your treatment has been planned according to current medical practices, but problems sometimes occur. Call your health care provider if you have any problems or questions after your procedure. °What can I expect after the procedure? °After your procedure, it is common: °· To feel sleepy for several hours. °· To feel clumsy and have poor balance for several hours. °· To have poor judgment for several hours. °· To vomit if you eat too soon. °Follow these instructions at home: °For at least 24 hours after the procedure: ° °·   Do not: °? Participate in activities where you could fall or become injured. °? Drive. °? Use heavy machinery. °? Drink alcohol. °? Take sleeping pills or medicines that cause drowsiness. °? Make important decisions or sign legal documents. °? Take care of children on your own. °· Rest. °Eating and drinking °· Follow the diet recommended by your health care provider. °· If you  vomit: °? Drink water, juice, or soup when you can drink without vomiting. °? Make sure you have little or no nausea before eating solid foods. °General instructions °· Have a responsible adult stay with you until you are awake and alert. °· Take over-the-counter and prescription medicines only as told by your health care provider. °· If you smoke, do not smoke without supervision. °· Keep all follow-up visits as told by your health care provider. This is important. °Contact a health care provider if: °· You keep feeling nauseous or you keep vomiting. °· You feel light-headed. °· You develop a rash. °· You have a fever. °Get help right away if: °· You have trouble breathing. °This information is not intended to replace advice given to you by your health care provider. Make sure you discuss any questions you have with your health care provider. °Document Released: 07/07/2013 Document Revised: 08/29/2017 Document Reviewed: 01/06/2016 °Elsevier Patient Education © 2020 [employer] ° °

## 2019-08-12 ENCOUNTER — Other Ambulatory Visit: Payer: Self-pay

## 2019-08-12 ENCOUNTER — Emergency Department (HOSPITAL_COMMUNITY)
Admission: EM | Admit: 2019-08-12 | Discharge: 2019-08-12 | Disposition: A | Discharge status: Home / Self Care | Payer: Medicare Other | Attending: Emergency Medicine | Admitting: Emergency Medicine

## 2019-08-12 DIAGNOSIS — I10 Essential (primary) hypertension: Secondary | ICD-10-CM | POA: Diagnosis not present

## 2019-08-12 DIAGNOSIS — R21 Rash and other nonspecific skin eruption: Secondary | ICD-10-CM | POA: Diagnosis not present

## 2019-08-12 DIAGNOSIS — T7840XA Allergy, unspecified, initial encounter: Secondary | ICD-10-CM

## 2019-08-12 DIAGNOSIS — Z86718 Personal history of other venous thrombosis and embolism: Secondary | ICD-10-CM | POA: Diagnosis not present

## 2019-08-12 DIAGNOSIS — T782XXA Anaphylactic shock, unspecified, initial encounter: Secondary | ICD-10-CM | POA: Insufficient documentation

## 2019-08-12 DIAGNOSIS — Z79899 Other long term (current) drug therapy: Secondary | ICD-10-CM | POA: Insufficient documentation

## 2019-08-12 LAB — CBC WITH DIFFERENTIAL/PLATELET
Abs Immature Granulocytes: 0.03 10*3/uL (ref 0.00–0.07)
Basophils Absolute: 0 10*3/uL (ref 0.0–0.1)
Basophils Relative: 0 %
Eosinophils Absolute: 0.1 10*3/uL (ref 0.0–0.5)
Eosinophils Relative: 1 %
HCT: 42.9 % (ref 36.0–46.0)
Hemoglobin: 13.4 g/dL (ref 12.0–15.0)
Immature Granulocytes: 0 %
Lymphocytes Relative: 28 %
Lymphs Abs: 2.5 10*3/uL (ref 0.7–4.0)
MCH: 29.9 pg (ref 26.0–34.0)
MCHC: 31.2 g/dL (ref 30.0–36.0)
MCV: 95.8 fL (ref 80.0–100.0)
Monocytes Absolute: 0.8 10*3/uL (ref 0.1–1.0)
Monocytes Relative: 9 %
Neutro Abs: 5.5 10*3/uL (ref 1.7–7.7)
Neutrophils Relative %: 62 %
Platelets: 253 10*3/uL (ref 150–400)
RBC: 4.48 MIL/uL (ref 3.87–5.11)
RDW: 13.8 % (ref 11.5–15.5)
WBC: 9 10*3/uL (ref 4.0–10.5)
nRBC: 0 % (ref 0.0–0.2)

## 2019-08-12 LAB — BASIC METABOLIC PANEL
Anion gap: 10 (ref 5–15)
BUN: 29 mg/dL — ABNORMAL HIGH (ref 8–23)
CO2: 23 mmol/L (ref 22–32)
Calcium: 8.8 mg/dL — ABNORMAL LOW (ref 8.9–10.3)
Chloride: 105 mmol/L (ref 98–111)
Creatinine, Ser: 1.2 mg/dL — ABNORMAL HIGH (ref 0.44–1.00)
GFR calc Af Amer: 53 mL/min — ABNORMAL LOW (ref >60)
GFR calc non Af Amer: 45 mL/min — ABNORMAL LOW (ref >60)
Glucose, Bld: 155 mg/dL — ABNORMAL HIGH (ref 70–99)
Potassium: 4.1 mmol/L (ref 3.5–5.1)
Sodium: 138 mmol/L (ref 135–145)

## 2019-08-12 MED ORDER — PREDNISONE 10 MG PO TABS: 20.0000 mg | ORAL_TABLET | Freq: Two times a day (BID) | ORAL | 0 refills | Status: DC

## 2019-08-12 MED ORDER — EPINEPHRINE 0.3 MG/0.3ML IJ SOAJ: 0.3000 mg | INTRAMUSCULAR | 0 refills | Status: DC | PRN

## 2019-08-12 MED ORDER — FAMOTIDINE IN NACL 20-0.9 MG/50ML-% IV SOLN
20.0000 mg | Freq: Once | INTRAVENOUS | Status: AC
  Administered 2019-08-12: 20 mg via INTRAVENOUS
  Filled 2019-08-12: qty 50

## 2019-08-12 MED ORDER — METHYLPREDNISOLONE SODIUM SUCC 125 MG IJ SOLR
125.0000 mg | Freq: Once | INTRAMUSCULAR | Status: AC
  Administered 2019-08-12: 125 mg via INTRAVENOUS
  Filled 2019-08-12: qty 2

## 2019-08-12 MED ORDER — EPINEPHRINE 0.3 MG/0.3ML IJ SOAJ
0.3000 mg | Freq: Once | INTRAMUSCULAR | Status: AC
  Administered 2019-08-12: 0.3 mg via INTRAMUSCULAR
  Filled 2019-08-12: qty 0.3

## 2019-08-12 MED ORDER — DIPHENHYDRAMINE HCL 50 MG/ML IJ SOLN
25.0000 mg | Freq: Once | INTRAMUSCULAR | Status: AC
  Administered 2019-08-12: 12:00:00 25 mg via INTRAVENOUS
  Filled 2019-08-12: qty 1

## 2019-08-12 NOTE — Discharge Instructions (Signed)
Prednisone twice daily as prescribed.  Begin taking Benadryl 25 mg every 6 hours for the next 3 days.  Use EpiPen if you experience another similar reaction in the future.  Return to the ER if your symptoms significantly worsen or change.

## 2019-08-12 NOTE — ED Notes (Signed)
This RN entered room to find pt diaphoretic, using abdominal muscles to breath, tripoding.  Rash was on hands and spreading towards proximal forearm.  EDP Delo called to bedside.

## 2019-08-12 NOTE — ED Triage Notes (Signed)
Pt reports to the ED for allergic reaction for ant bites. Patient reports use of benadryl and zyrtec for relif without relief. Pt states she is having trouble breathing.

## 2019-08-12 NOTE — ED Provider Notes (Addendum)
[facility] DEPT Provider Note   CSN: [id number] Arrival date & time: 08/12/19  1132     History   Chief Complaint No chief complaint on file.   HPI Rebecca Chen is a 73 y.o. female.     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  She presents today with complaints of rash to the arms and legs along with difficulty breathing.  This began suddenly after she was exposed to ants.    The history is provided by the patient.  Allergic Reaction Presenting symptoms: difficulty breathing, itching, rash and swelling   Severity:  Severe Duration:  1 hour Context comment:  Ants Relieved by:  Nothing Worsened by:  Nothing Ineffective treatments:  None tried   Past Medical History:  Diagnosis Date  . AKI (acute kidney injury) ([place]) 01/01/2019  . Hypertension     Patient Active Problem List   Diagnosis Date Noted  . Leg DVT (deep venous thromboembolism), acute, right ([place]) 01/01/2019  . Iron deficiency anemia, unspecified 01/01/2019  . Symptomatic anemia 12/30/2018    Past Surgical History:  Procedure Laterality Date  . FOOT SURGERY    . IR IVC FILTER PLMT / S&I /IMG GUID/MOD SED  01/01/2019  . IR IVC FILTER RETRIEVAL / S&I /IMG GUID/MOD SED  08/09/2019  . IR RADIOLOGIST EVAL & MGMT  07/21/2019     OB History   No obstetric history on file.      Home Medications    Prior to Admission medications   Medication Sig Start Date End Date Taking? Authorizing Provider  Ascorbic Acid (VITAMIN C PO) Take 1 tablet by mouth daily.    [provider]  atenolol (TENORMIN) 50 MG tablet Take 50 mg by mouth daily.    [provider]  CALCIUM PO Take 1 tablet by mouth daily.    [provider]  CRANBERRY PO Take 1 tablet by mouth daily.    [provider]  Cyanocobalamin (VITAMIN B-12 PO) Take 1 tablet by mouth daily.     [provider]  NIACIN PO Take 1 tablet by mouth daily.    [provider]  Pyridoxine HCl (VITAMIN B-6 PO) Take 1 tablet by mouth daily.    [provider]  simvastatin (ZOCOR) 40 MG tablet Take 40 mg by mouth daily.    [provider]  VITAMIN D PO Take 1 tablet by mouth daily.    [provider]  VITAMIN E PO Take 1 tablet by mouth daily.    [provider]  zolpidem (AMBIEN) 10 MG tablet Take 10 mg by mouth at bedtime as needed for sleep.  12/15/18   [provider]    Family History No family history on file.  Social History Social History   Tobacco Use  . Smoking status: Never Smoker  . Smokeless tobacco: Never Used  Substance Use Topics  . Alcohol use: Not on file  . Drug use: Not on file     Allergies   Patient has no known allergies.   Review of Systems Review of Systems  Skin: Positive for itching and rash.  All other systems reviewed and are negative.    Physical Exam Updated Vital Signs There were no vitals taken for this visit.  Physical Exam Vitals signs and nursing note reviewed.  Constitutional:      General: She is not in acute distress.  Appearance: She is well-developed. She is not diaphoretic.     Comments: Patient appears extremely anxious and is moaning and rocking back and forth.  HENT:     Head: Normocephalic and atraumatic.     Mouth/Throat:     Mouth: Mucous membranes are moist.  Neck:     Musculoskeletal: Normal range of motion and neck supple.  Cardiovascular:     Rate and Rhythm: Normal rate and regular rhythm.     Heart sounds: No murmur. No friction rub. No gallop.   Pulmonary:     Effort: Pulmonary effort is normal. No respiratory distress.     Breath sounds: Normal breath sounds. No stridor. No wheezing.  Abdominal:     General: Bowel sounds are normal. There is no distension.     Palpations: Abdomen is soft.     Tenderness: There is no abdominal tenderness.   Musculoskeletal: Normal range of motion.  Skin:    General: Skin is warm and dry.     Findings: Rash present.     Comments: There is an urticarial rash to the forearms and lower legs.  Neurological:     Mental Status: She is alert and oriented to person, place, and time.      ED Treatments / Results  Labs (all labs ordered are listed, but only abnormal results are displayed) Labs Reviewed  CBC WITH DIFFERENTIAL/PLATELET  BASIC METABOLIC PANEL    EKG None  Radiology No results found.  Procedures Procedures (including critical care time)  Medications Ordered in ED Medications  famotidine (PEPCID) IVPB 20 mg premix (has no administration in time range)  methylPREDNISolone sodium succinate (SOLU-MEDROL) 125 mg/2 mL injection 125 mg (has no administration in time range)  EPINEPHrine (EPI-PEN) injection 0.3 mg (has no administration in time range)  diphenhydrAMINE (BENADRYL) injection 25 mg (has no administration in time range)     Initial Impression / Assessment and Plan / ED Course  I have reviewed the triage vital signs and the nursing notes.  Pertinent labs & imaging results that were available during my care of the patient were reviewed by me and considered in my medical decision making (see chart for details).  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  She presents today after picking up a piece of firewood she intended to purchase and it had ants on it.  She then began to have a generalized rash and itching all over.  She presented here with hives and feeling short of breath.  She was given an EpiPen along with Benadryl, Solu-Medrol, Pepcid with good results.  She is now feeling significantly better and rash is improved.  Her vital signs have remained stable and there has been no hypoxia.  At this point, I feel as though patient is appropriate for discharge.  She will be continued on prednisone, Benadryl, and is to follow-up as needed if not improving.  CRITICAL  CARE Performed by: Veryl Speak Total critical care time: 35 minutes Critical care time was exclusive of separately billable procedures and treating other patients. Critical care was necessary to treat or prevent imminent or life-threatening deterioration. Critical care was time spent personally by me on the following activities: development of treatment plan with patient and/or surrogate as well as nursing, discussions with consultants, evaluation of patient's response to treatment, examination of patient, obtaining history from patient or surrogate, ordering and performing treatments and interventions, ordering and review of laboratory studies, ordering and review of radiographic studies, pulse oximetry and re-evaluation of  patient's condition.   Final Clinical Impressions(s) / ED Diagnoses   Final diagnoses:  None    ED Discharge Orders    None       Veryl Speak, MD 08/12/19 1512    Veryl Speak, MD 08/12/19 1513

## 2019-10-25 DIAGNOSIS — M25562 Pain in left knee: Secondary | ICD-10-CM | POA: Diagnosis not present

## 2020-02-02 DIAGNOSIS — R300 Dysuria: Secondary | ICD-10-CM | POA: Diagnosis not present

## 2020-02-29 DIAGNOSIS — Z862 Personal history of diseases of the blood and blood-forming organs and certain disorders involving the immune mechanism: Secondary | ICD-10-CM | POA: Diagnosis not present

## 2020-04-17 DIAGNOSIS — H66003 Acute suppurative otitis media without spontaneous rupture of ear drum, bilateral: Secondary | ICD-10-CM | POA: Diagnosis not present

## 2020-04-26 DIAGNOSIS — L570 Actinic keratosis: Secondary | ICD-10-CM | POA: Diagnosis not present

## 2020-04-26 DIAGNOSIS — D225 Melanocytic nevi of trunk: Secondary | ICD-10-CM | POA: Diagnosis not present

## 2020-04-26 DIAGNOSIS — Z85828 Personal history of other malignant neoplasm of skin: Secondary | ICD-10-CM | POA: Diagnosis not present

## 2020-04-26 DIAGNOSIS — L578 Other skin changes due to chronic exposure to nonionizing radiation: Secondary | ICD-10-CM | POA: Diagnosis not present

## 2020-04-26 DIAGNOSIS — L814 Other melanin hyperpigmentation: Secondary | ICD-10-CM | POA: Diagnosis not present

## 2020-04-26 DIAGNOSIS — L821 Other seborrheic keratosis: Secondary | ICD-10-CM | POA: Diagnosis not present

## 2020-05-03 DIAGNOSIS — H66002 Acute suppurative otitis media without spontaneous rupture of ear drum, left ear: Secondary | ICD-10-CM | POA: Diagnosis not present

## 2020-05-03 DIAGNOSIS — H6983 Other specified disorders of Eustachian tube, bilateral: Secondary | ICD-10-CM | POA: Diagnosis not present

## 2020-05-03 DIAGNOSIS — J329 Chronic sinusitis, unspecified: Secondary | ICD-10-CM | POA: Diagnosis not present

## 2020-05-11 DIAGNOSIS — H9203 Otalgia, bilateral: Secondary | ICD-10-CM | POA: Diagnosis not present

## 2020-05-11 DIAGNOSIS — R070 Pain in throat: Secondary | ICD-10-CM | POA: Diagnosis not present

## 2020-05-11 DIAGNOSIS — H838X3 Other specified diseases of inner ear, bilateral: Secondary | ICD-10-CM | POA: Diagnosis not present

## 2020-05-11 DIAGNOSIS — H903 Sensorineural hearing loss, bilateral: Secondary | ICD-10-CM | POA: Diagnosis not present

## 2020-05-15 DIAGNOSIS — D649 Anemia, unspecified: Secondary | ICD-10-CM | POA: Diagnosis not present

## 2020-05-16 DIAGNOSIS — D649 Anemia, unspecified: Secondary | ICD-10-CM | POA: Diagnosis not present

## 2020-05-19 DIAGNOSIS — D649 Anemia, unspecified: Secondary | ICD-10-CM | POA: Diagnosis not present

## 2020-05-23 ENCOUNTER — Other Ambulatory Visit: Payer: Self-pay | Admitting: Family Medicine

## 2020-05-23 DIAGNOSIS — Z1231 Encounter for screening mammogram for malignant neoplasm of breast: Secondary | ICD-10-CM

## 2020-05-25 DIAGNOSIS — H9203 Otalgia, bilateral: Secondary | ICD-10-CM | POA: Diagnosis not present

## 2020-05-25 DIAGNOSIS — R070 Pain in throat: Secondary | ICD-10-CM | POA: Diagnosis not present

## 2020-05-25 DIAGNOSIS — Z23 Encounter for immunization: Secondary | ICD-10-CM | POA: Diagnosis not present

## 2020-06-02 DIAGNOSIS — Z1159 Encounter for screening for other viral diseases: Secondary | ICD-10-CM | POA: Diagnosis not present

## 2020-06-07 DIAGNOSIS — K449 Diaphragmatic hernia without obstruction or gangrene: Secondary | ICD-10-CM | POA: Diagnosis not present

## 2020-06-07 DIAGNOSIS — D509 Iron deficiency anemia, unspecified: Secondary | ICD-10-CM | POA: Diagnosis not present

## 2020-06-07 DIAGNOSIS — K5730 Diverticulosis of large intestine without perforation or abscess without bleeding: Secondary | ICD-10-CM | POA: Diagnosis not present

## 2020-06-07 DIAGNOSIS — K648 Other hemorrhoids: Secondary | ICD-10-CM | POA: Diagnosis not present

## 2020-06-08 ENCOUNTER — Emergency Department (HOSPITAL_COMMUNITY)
Admission: EM | Admit: 2020-06-08 | Discharge: 2020-06-08 | Disposition: A | Discharge status: Home / Self Care | Payer: Medicare Other | Attending: Emergency Medicine | Admitting: Emergency Medicine

## 2020-06-08 ENCOUNTER — Encounter (HOSPITAL_COMMUNITY): Payer: Self-pay | Admitting: *Deleted

## 2020-06-08 ENCOUNTER — Other Ambulatory Visit: Payer: Self-pay

## 2020-06-08 DIAGNOSIS — I10 Essential (primary) hypertension: Secondary | ICD-10-CM | POA: Diagnosis not present

## 2020-06-08 DIAGNOSIS — Z79899 Other long term (current) drug therapy: Secondary | ICD-10-CM | POA: Diagnosis not present

## 2020-06-08 DIAGNOSIS — K920 Hematemesis: Secondary | ICD-10-CM

## 2020-06-08 LAB — COMPREHENSIVE METABOLIC PANEL
ALT: 19 U/L (ref 0–44)
AST: 21 U/L (ref 15–41)
Albumin: 2.3 g/dL — ABNORMAL LOW (ref 3.5–5.0)
Alkaline Phosphatase: 73 U/L (ref 38–126)
Anion gap: 8 (ref 5–15)
BUN: 9 mg/dL (ref 8–23)
CO2: 27 mmol/L (ref 22–32)
Calcium: 8.4 mg/dL — ABNORMAL LOW (ref 8.9–10.3)
Chloride: 102 mmol/L (ref 98–111)
Creatinine, Ser: 0.98 mg/dL (ref 0.44–1.00)
GFR calc Af Amer: >60 mL/min (ref >60)
GFR calc non Af Amer: 58 mL/min — ABNORMAL LOW (ref >60)
Glucose, Bld: 118 mg/dL — ABNORMAL HIGH (ref 70–99)
Potassium: 4 mmol/L (ref 3.5–5.1)
Sodium: 137 mmol/L (ref 135–145)
Total Bilirubin: 0.3 mg/dL (ref 0.3–1.2)
Total Protein: 7.1 g/dL (ref 6.5–8.1)

## 2020-06-08 LAB — CBC
HCT: 28.8 % — ABNORMAL LOW (ref 36.0–46.0)
Hemoglobin: 8.6 g/dL — ABNORMAL LOW (ref 12.0–15.0)
MCH: 25.7 pg — ABNORMAL LOW (ref 26.0–34.0)
MCHC: 29.9 g/dL — ABNORMAL LOW (ref 30.0–36.0)
MCV: 86.2 fL (ref 80.0–100.0)
Platelets: 524 10*3/uL — ABNORMAL HIGH (ref 150–400)
RBC: 3.34 MIL/uL — ABNORMAL LOW (ref 3.87–5.11)
RDW: 15.3 % (ref 11.5–15.5)
WBC: 13.9 10*3/uL — ABNORMAL HIGH (ref 4.0–10.5)
nRBC: 0 % (ref 0.0–0.2)

## 2020-06-08 LAB — TYPE AND SCREEN
ABO/RH(D): O POS
Antibody Screen: NEGATIVE

## 2020-06-08 LAB — POC OCCULT BLOOD, ED: Fecal Occult Bld: NEGATIVE

## 2020-06-08 MED ORDER — PANTOPRAZOLE SODIUM 40 MG IV SOLR: 40.0000 mg | Freq: Once | INTRAVENOUS | Status: DC

## 2020-06-08 MED ORDER — PANTOPRAZOLE SODIUM 40 MG PO TBEC
40.0000 mg | DELAYED_RELEASE_TABLET | Freq: Once | ORAL | Status: AC
  Administered 2020-06-08: 40 mg via ORAL
  Filled 2020-06-08: qty 1

## 2020-06-08 MED ORDER — PANTOPRAZOLE SODIUM 40 MG PO TBEC: 40.0000 mg | DELAYED_RELEASE_TABLET | Freq: Every day | ORAL | 0 refills | Status: DC

## 2020-06-08 NOTE — ED Notes (Signed)
Patient does not want an IV. Dr. Zenia Resides wants patient to have PO meds.

## 2020-06-08 NOTE — ED Triage Notes (Signed)
Pt states that she had a colonoscopy/endoscopy completed yesterday. Reporting 1 episode of "moderate amounts of" coffee ground emesis around 1600 today. Pain in the upper abdominal area. Reporting last HGB was 9.8 three weeks ago.

## 2020-06-08 NOTE — ED Provider Notes (Addendum)
[place] DEPT Provider Note   CSN: [id number] Arrival date & time: 06/08/20  1904     History Chief Complaint  Patient presents with  . Hematemesis    Rebecca Chen is a 74 y.o. female.  74 year old female presents with hematemesis x1 today.  Patient states that yesterday she had a EGD as well as colonoscopy done.  She has had longstanding history of bleeding disorder, no etiology.  Denies any black stools.  No fever or chills per denies any abdominal discomfort.  Patient states that she is scheduled to have a mid gut study in the near future.  Sees Dr. Paulita Fujita and spoke to his assistant was told to come in for further management.        Past Medical History:  Diagnosis Date  . AKI (acute kidney injury) ([place]) 01/01/2019  . Hypertension     Patient Active Problem List   Diagnosis Date Noted  . Leg DVT (deep venous thromboembolism), acute, right ([place]) 01/01/2019  . Iron deficiency anemia, unspecified 01/01/2019  . Symptomatic anemia 12/30/2018    Past Surgical History:  Procedure Laterality Date  . FOOT SURGERY    . IR IVC FILTER PLMT / S&I /IMG GUID/MOD SED  01/01/2019  . IR IVC FILTER RETRIEVAL / S&I /IMG GUID/MOD SED  08/09/2019  . IR RADIOLOGIST EVAL & MGMT  07/21/2019     OB History   No obstetric history on file.     No family history on file.  Social History   Tobacco Use  . Smoking status: Never Smoker  . Smokeless tobacco: Never Used  Substance Use Topics  . Alcohol use: Not on file  . Drug use: Not on file    Home Medications Prior to Admission medications   Medication Sig Start Date End Date Taking? Authorizing Provider  Ascorbic Acid (VITAMIN C PO) Take 1 tablet by mouth daily.    [provider]  atenolol (TENORMIN) 50 MG tablet Take 50 mg by mouth daily.    [provider]  CALCIUM PO Take 1 tablet by mouth daily.    [provider]  CRANBERRY PO Take 1 tablet by mouth daily.     [provider]  Cyanocobalamin (VITAMIN B-12 PO) Take 1 tablet by mouth daily.    [provider]  EPINEPHrine (EPIPEN 2-PAK) 0.3 mg/0.3 mL IJ SOAJ injection Inject 0.3 mLs (0.3 mg total) into the muscle as needed for anaphylaxis. 08/12/19   Veryl Speak, MD  NIACIN PO Take 1 tablet by mouth daily.    [provider]  predniSONE (DELTASONE) 10 MG tablet Take 2 tablets (20 mg total) by mouth 2 (two) times daily with a meal. 08/12/19   Veryl Speak, MD  Pyridoxine HCl (VITAMIN B-6 PO) Take 1 tablet by mouth daily.    [provider]  simvastatin (ZOCOR) 40 MG tablet Take 40 mg by mouth every evening.     [provider]  VITAMIN D PO Take 1 tablet by mouth daily.    [provider]  VITAMIN E PO Take 1 tablet by mouth daily.    [provider]  zolpidem (AMBIEN) 10 MG tablet Take 10 mg by mouth at bedtime as needed for sleep.  12/15/18   [provider]    Allergies    Bee venom  Review of Systems   Review of Systems  All other systems reviewed and are negative.   Physical Exam Updated Vital Signs BP (!) 144/83 (  BP Location: Left Arm)   Pulse 87   Temp 99.3 F (37.4 C) (Oral)   Resp 18   Ht 1.727 m (5\' 8" )   Wt 77.9 kg   SpO2 97%   BMI 26.11 kg/m   Physical Exam Vitals and nursing note reviewed.  Constitutional:      General: She is not in acute distress.    Appearance: Normal appearance. She is well-developed. She is not toxic-appearing.  HENT:     Head: Normocephalic and atraumatic.  Eyes:     General: Lids are normal.     Conjunctiva/sclera: Conjunctivae normal.     Pupils: Pupils are equal, round, and reactive to light.  Neck:     Thyroid: No thyroid mass.     Trachea: No tracheal deviation.  Cardiovascular:     Rate and Rhythm: Normal rate and regular rhythm.     Heart sounds: Normal heart sounds. No murmur heard.  No gallop.   Pulmonary:     Effort: Pulmonary effort is normal. No  respiratory distress.     Breath sounds: Normal breath sounds. No stridor. No decreased breath sounds, wheezing, rhonchi or rales.  Abdominal:     General: Bowel sounds are normal. There is no distension.     Palpations: Abdomen is soft.     Tenderness: There is no abdominal tenderness. There is no rebound.  Musculoskeletal:        General: No tenderness. Normal range of motion.     Cervical back: Normal range of motion and neck supple.  Skin:    General: Skin is warm and dry.     Findings: No abrasion or rash.  Neurological:     Mental Status: She is alert and oriented to person, place, and time.     GCS: GCS eye subscore is 4. GCS verbal subscore is 5. GCS motor subscore is 6.     Cranial Nerves: No cranial nerve deficit.     Sensory: No sensory deficit.  Psychiatric:        Speech: Speech normal.        Behavior: Behavior normal.     ED Results / Procedures / Treatments   Labs (all labs ordered are listed, but only abnormal results are displayed) Labs Reviewed  COMPREHENSIVE METABOLIC PANEL  CBC  POC OCCULT BLOOD, ED  TYPE AND SCREEN    EKG None  Radiology No results found.  Procedures Procedures (including critical care time)  Medications Ordered in ED Medications - No data to display  ED Course  I have reviewed the triage vital signs and the nursing notes.  Pertinent labs & imaging results that were available during my care of the patient were reviewed by me and considered in my medical decision making (see chart for details).    MDM Rules/Calculators/A&P                          Discussed with Dr. Narda Amber from [facility] and recommends that patient be discharged outpatient follow-up and placed on PPI.  He reviewed patient's EGD from yesterday and did not show any evidence of bleeding.  Patient is comfortable with this.  Her hemoglobin is stable.  Offered her IV Protonix which she has deferred and request that she be given oral instead.  I think this is  reasonable. Final Clinical Impression(s) / ED Diagnoses Final diagnoses:  None    Rx / DC Orders ED Discharge Orders    None  Lacretia Leigh, MD 06/08/20 2227    Lacretia Leigh, MD 06/08/20 2228

## 2020-06-08 NOTE — Discharge Instructions (Signed)
Follow-up with Dr. Paulita Fujita and return here for any bleeding

## 2020-06-09 ENCOUNTER — Other Ambulatory Visit: Payer: Self-pay | Admitting: Gastroenterology

## 2020-06-09 DIAGNOSIS — D509 Iron deficiency anemia, unspecified: Secondary | ICD-10-CM

## 2020-06-15 ENCOUNTER — Ambulatory Visit
Admission: RE | Admit: 2020-06-15 | Discharge: 2020-06-15 | Disposition: A | Discharge status: Home / Self Care | Payer: Medicare Other | Source: Ambulatory Visit | Attending: Gastroenterology | Admitting: Gastroenterology

## 2020-06-15 DIAGNOSIS — K449 Diaphragmatic hernia without obstruction or gangrene: Secondary | ICD-10-CM | POA: Diagnosis not present

## 2020-06-15 DIAGNOSIS — M4319 Spondylolisthesis, multiple sites in spine: Secondary | ICD-10-CM | POA: Diagnosis not present

## 2020-06-15 DIAGNOSIS — I700 Atherosclerosis of aorta: Secondary | ICD-10-CM | POA: Diagnosis not present

## 2020-06-15 DIAGNOSIS — R1110 Vomiting, unspecified: Secondary | ICD-10-CM | POA: Diagnosis not present

## 2020-06-15 DIAGNOSIS — D509 Iron deficiency anemia, unspecified: Secondary | ICD-10-CM

## 2020-06-15 MED ORDER — IOPAMIDOL (ISOVUE-370) INJECTION 76%
75.0000 mL | Freq: Once | INTRAVENOUS | Status: AC | PRN
  Administered 2020-06-15: 75 mL via INTRAVENOUS

## 2020-06-19 ENCOUNTER — Other Ambulatory Visit: Payer: Medicare Other

## 2020-06-20 DIAGNOSIS — Z862 Personal history of diseases of the blood and blood-forming organs and certain disorders involving the immune mechanism: Secondary | ICD-10-CM | POA: Diagnosis not present

## 2020-06-20 DIAGNOSIS — Z0000 Encounter for general adult medical examination without abnormal findings: Secondary | ICD-10-CM | POA: Diagnosis not present

## 2020-06-20 DIAGNOSIS — E559 Vitamin D deficiency, unspecified: Secondary | ICD-10-CM | POA: Diagnosis not present

## 2020-06-20 DIAGNOSIS — D509 Iron deficiency anemia, unspecified: Secondary | ICD-10-CM | POA: Diagnosis not present

## 2020-06-20 DIAGNOSIS — I10 Essential (primary) hypertension: Secondary | ICD-10-CM | POA: Diagnosis not present

## 2020-06-20 DIAGNOSIS — E785 Hyperlipidemia, unspecified: Secondary | ICD-10-CM | POA: Diagnosis not present

## 2020-06-20 DIAGNOSIS — Z86010 Personal history of colonic polyps: Secondary | ICD-10-CM | POA: Diagnosis not present

## 2020-06-22 ENCOUNTER — Other Ambulatory Visit: Payer: Medicare Other

## 2020-06-23 ENCOUNTER — Telehealth: Payer: Self-pay | Admitting: Nurse Practitioner

## 2020-06-23 NOTE — Telephone Encounter (Signed)
Received a new hem referral from Dr. Belva Bertin at [facility] for chronic anemia. Rebecca Chen has been cld and schedule to see Rebecca Chen on 9/29 at 145pm. Pt aware to arrive 30 minutes early.

## 2020-06-28 ENCOUNTER — Inpatient Hospital Stay: Payer: Medicare Other | Attending: Nurse Practitioner | Admitting: Nurse Practitioner

## 2020-06-28 ENCOUNTER — Other Ambulatory Visit: Payer: Self-pay

## 2020-06-28 ENCOUNTER — Inpatient Hospital Stay: Payer: Medicare Other

## 2020-06-28 VITALS — BP 145/72 | HR 78 | Temp 98.5°F | Resp 18 | Ht 68.0 in | Wt 170.3 lb

## 2020-06-28 DIAGNOSIS — R000 Tachycardia, unspecified: Secondary | ICD-10-CM | POA: Insufficient documentation

## 2020-06-28 DIAGNOSIS — Z7952 Long term (current) use of systemic steroids: Secondary | ICD-10-CM | POA: Insufficient documentation

## 2020-06-28 DIAGNOSIS — Z79899 Other long term (current) drug therapy: Secondary | ICD-10-CM | POA: Diagnosis not present

## 2020-06-28 DIAGNOSIS — I10 Essential (primary) hypertension: Secondary | ICD-10-CM | POA: Diagnosis not present

## 2020-06-28 DIAGNOSIS — R6889 Other general symptoms and signs: Secondary | ICD-10-CM

## 2020-06-28 DIAGNOSIS — G43909 Migraine, unspecified, not intractable, without status migrainosus: Secondary | ICD-10-CM | POA: Insufficient documentation

## 2020-06-28 DIAGNOSIS — D649 Anemia, unspecified: Secondary | ICD-10-CM

## 2020-06-28 DIAGNOSIS — R5383 Other fatigue: Secondary | ICD-10-CM | POA: Diagnosis not present

## 2020-06-28 DIAGNOSIS — Z8719 Personal history of other diseases of the digestive system: Secondary | ICD-10-CM | POA: Diagnosis not present

## 2020-06-28 LAB — CBC WITH DIFFERENTIAL (CANCER CENTER ONLY)
Abs Immature Granulocytes: 0.05 10*3/uL (ref 0.00–0.07)
Basophils Absolute: 0.1 10*3/uL (ref 0.0–0.1)
Basophils Relative: 1 %
Eosinophils Absolute: 0.3 10*3/uL (ref 0.0–0.5)
Eosinophils Relative: 2 %
HCT: 29.2 % — ABNORMAL LOW (ref 36.0–46.0)
Hemoglobin: 8.8 g/dL — ABNORMAL LOW (ref 12.0–15.0)
Immature Granulocytes: 0 %
Lymphocytes Relative: 10 %
Lymphs Abs: 1.3 10*3/uL (ref 0.7–4.0)
MCH: 25.2 pg — ABNORMAL LOW (ref 26.0–34.0)
MCHC: 30.1 g/dL (ref 30.0–36.0)
MCV: 83.7 fL (ref 80.0–100.0)
Monocytes Absolute: 0.9 10*3/uL (ref 0.1–1.0)
Monocytes Relative: 8 %
Neutro Abs: 9.5 10*3/uL — ABNORMAL HIGH (ref 1.7–7.7)
Neutrophils Relative %: 79 %
Platelet Count: 484 10*3/uL — ABNORMAL HIGH (ref 150–400)
RBC: 3.49 MIL/uL — ABNORMAL LOW (ref 3.87–5.11)
RDW: 16.4 % — ABNORMAL HIGH (ref 11.5–15.5)
WBC Count: 12.1 10*3/uL — ABNORMAL HIGH (ref 4.0–10.5)
nRBC: 0 % (ref 0.0–0.2)

## 2020-06-28 LAB — VITAMIN B12: Vitamin B-12: 731 pg/mL (ref 180–914)

## 2020-06-28 LAB — FOLATE: Folate: 15.4 ng/mL (ref >5.9)

## 2020-06-28 NOTE — Progress Notes (Signed)
Called [facility] per Regan Rakers NP to get patient's  colonoscopy and endoscopy from 2019 and 2020. Left message with patient information, fax number and call back number.

## 2020-06-28 NOTE — Progress Notes (Addendum)
[place]  [phone] [phone]) [place] consult Note   Patient Care Team: Christa See, FNP as PCP - General (Family Medicine) [name], NP as Nurse Practitioner (Nurse Practitioner) [name], MD as Consulting Physician (Hematology) [name], MD as Consulting Physician (Gastroenterology) Date of Service: 06/28/2020   CHIEF COMPLAINTS/PURPOSE OF CONSULTATION:  Iron deficient Anemia, referred by [name]   HISTORY OF PRESENTING ILLNESS:  Marrianne Chen 74 y.o. female with PMH HTN, HL, and migraines is here because of anemia. She was found to have abnormal CBC from 12/30/2018 when she was found to have severe anemia Hg 5.1 with leukocytosis and thrombocytosis in the setting of recent respiratory infection treated with antibiotics and what she describes as steroid-induced gastritis. FOBT negative. Ferritin 4 s/p IV Feraheme x1. Hg 10/4 in 09/2018 and normal in 2018. Last colonoscopy in 2019 showed a polyp. Per patient her usual clinical course includes steroid-induced gastritis and anemia following abx/glucocorticoids for respiratory infections since her 20's. In 12/2018 her hospital work up found to have DVT in the right posterior tibial vein s/p IVC filter which was later retrieved in 08/2019, CBC at that time was normal. She developed a recurrent anemia in 04/2020 after respiratory and ear infections s/p antibiotics and 2 courses of steroids. Her anemia has not resolved in the usual fashion. She started oral iron 150 mg once daily in 03/2020, stools are black. Ferritin normalized to 119 and Hg 9.4 on 06/21/20. She has had recent EGD and colonoscopy which showed hiatal hernia which was felt to be a possible source of iron deficiency, per the report duodenal biopsies from last year were negative for celiac disease. The colonoscopy report was not attached, per patient only diverticulosis was noted. She is on PPI, stopped aspirin 81 mg in the  last month or so. She had no prior history or diagnosis of cancer. Her age appropriate screening programs are up-to-date. She denies any pica and eats a variety of diet. She donated blood in college but not recently. She received blood transfusion during 12/2018 hospitalization.   Socially she is single, lives alone with 2 cats. She is active, exercises. 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 She denies tobacco, alcohol, or drug use. Denies family history of anemia or cancer.   Today, she presents by herself. She has low stamina, fatigue, tachycardia with exertion. 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 Denies dyspnea, or pre-syncopal episodes. She had not noticed any recent bleeding such as epistaxis, hematuria or hematochezia.     MEDICAL HISTORY:  Past Medical History:  Diagnosis Date  . AKI (acute kidney injury) ([place]) 01/01/2019  . Hypertension     SURGICAL HISTORY: Past Surgical History:  Procedure Laterality Date  . FOOT SURGERY    . IR IVC FILTER PLMT / S&I /IMG GUID/MOD SED  01/01/2019  . IR IVC FILTER RETRIEVAL / S&I /IMG GUID/MOD SED  08/09/2019  . IR RADIOLOGIST EVAL & MGMT  07/21/2019    SOCIAL HISTORY: Social History   Socioeconomic History  . Marital status: Single    Spouse name: Not on file  . Number of children: Not on file  . Years of education: Not on file  . Highest education level: Not on file  Occupational History  . Not on file  Tobacco Use  . Smoking status: Never Smoker  . Smokeless tobacco: Never Used  Substance and Sexual Activity  . Alcohol use: Not  on file  . Drug use: Not on file  . Sexual activity: Not on file  Other Topics Concern  . Not on file  Social History Narrative  . Not on file   Social Determinants of Health   Financial Resource Strain:   . Difficulty of Paying Living Expenses: Not on file  Food Insecurity:   . Worried About Charity fundraiser in the Last Year: Not on file  . Ran Out of Food in the Last Year:  Not on file  Transportation Needs:   . Lack of Transportation (Medical): Not on file  . Lack of Transportation (Non-Medical): Not on file  Physical Activity:   . Days of Exercise per Week: Not on file  . Minutes of Exercise per Session: Not on file  Stress:   . Feeling of Stress : Not on file  Social Connections:   . Frequency of Communication with Friends and Family: Not on file  . Frequency of Social Gatherings with Friends and Family: Not on file  . Attends Religious Services: Not on file  . Active Member of Clubs or Organizations: Not on file  . Attends Archivist Meetings: Not on file  . Marital Status: Not on file  Intimate Partner Violence:   . Fear of Current or Ex-Partner: Not on file  . Emotionally Abused: Not on file  . Physically Abused: Not on file  . Sexually Abused: Not on file    FAMILY HISTORY: No family history on file.  ALLERGIES:  is allergic to mixed vespid venom and bee venom.  MEDICATIONS:  Current Outpatient Medications  Medication Sig Dispense Refill  . atenolol (TENORMIN) 50 MG tablet Take 50 mg by mouth daily.    [name] EPINEPHrine (EPIPEN 2-PAK) 0.3 mg/0.3 mL IJ SOAJ injection Inject 0.3 mLs (0.3 mg total) into the muscle as needed for anaphylaxis. 1 each 0  . ferrous sulfate 325 (65 FE) MG tablet Take 325 mg by mouth daily with breakfast.    . pantoprazole (PROTONIX) 40 MG tablet Take 1 tablet (40 mg total) by mouth daily. 30 tablet 0  . simvastatin (ZOCOR) 40 MG tablet Take 40 mg by mouth every evening.     [name] VITAMIN D PO Take 1 tablet by mouth daily.    [name] zolpidem (AMBIEN) 10 MG tablet Take 10 mg by mouth at bedtime as needed for sleep.     . Ascorbic Acid (VITAMIN C PO) Take 1 tablet by mouth daily. (Patient not taking: Reported on 06/28/2020)    . CALCIUM PO Take 1 tablet by mouth daily. (Patient not taking: Reported on 06/28/2020)    . CRANBERRY PO Take 1 tablet by mouth daily. (Patient not taking: Reported on 06/28/2020)    .  Cyanocobalamin (VITAMIN B-12 PO) Take 1 tablet by mouth daily. (Patient not taking: Reported on 06/28/2020)    . NIACIN PO Take 1 tablet by mouth daily. (Patient not taking: Reported on 06/28/2020)    . predniSONE (DELTASONE) 10 MG tablet Take 2 tablets (20 mg total) by mouth 2 (two) times daily with a meal. (Patient not taking: Reported on 06/28/2020) 12 tablet 0  . Pyridoxine HCl (VITAMIN B-6 PO) Take 1 tablet by mouth daily. (Patient not taking: Reported on 06/28/2020)    . VITAMIN E PO Take 1 tablet by mouth daily. (Patient not taking: Reported on 06/28/2020)     No current facility-administered medications for this visit.    REVIEW OF SYSTEMS:   Constitutional: Denies fevers, chills  or abnormal night sweats (+) fatigue  Eyes: Denies blurriness of vision, double vision or watery eyes Ears, nose, mouth, throat, and face: Denies mucositis or sore throat Respiratory: Denies cough, dyspnea or wheezes Cardiovascular: Denies chest discomfort or lower extremity swelling (+) exertional palpitations Gastrointestinal:  Denies nausea, heartburn or change in bowel habits (+) dark stools on oral iron (+) hiatal hernia Skin: Denies abnormal skin rashes Lymphatics: Denies new lymphadenopathy or easy bruising Neurological:Denies numbness, tingling or new weaknesses Behavioral/Psych: Chen is stable, no new changes  All other systems were reviewed with the patient and are negative.  PHYSICAL EXAMINATION: ECOG PERFORMANCE STATUS: 1 - Symptomatic but completely ambulatory  Vitals:   06/28/20 1329  BP: (!) 145/72  Pulse: 78  Resp: 18  Temp: 98.5 F (36.9 C)  SpO2: 100%   Filed Weights   06/28/20 1329  Weight: 170 lb 4.8 oz (77.2 kg)    GENERAL:alert, no distress and comfortable SKIN: pale and dry, no obvious rash  EYES:  sclera clear NECK: without mass LYMPH:  no palpable cervical or supraclavicular lymphadenopathy  LUNGS: clear with normal breathing effort HEART: regular rate & rhythm, no  lower extremity edema ABDOMEN:abdomen soft, non-tender and normal bowel sounds. No hepatosplenomegaly  Musculoskeletal:no cyanosis of digits and no clubbing PSYCH: alert & oriented x 3 with fluent speech NEURO: no focal motor/sensory deficits  LABORATORY DATA:  I have reviewed the data as listed CBC Latest Ref Rng & Units 06/28/2020 06/08/2020 08/12/2019  WBC 4.0 - 10.5 K/uL 12.1(H) 13.9(H) 9.0  Hemoglobin 12.0 - 15.0 g/dL 8.8(L) 8.6(L) 13.4  Hematocrit 36 - 46 % 29.2(L) 28.8(L) 42.9  Platelets 150 - 400 K/uL 484(H) 524(H) 253    CMP Latest Ref Rng & Units 06/08/2020 08/12/2019 08/09/2019  Glucose 70 - 99 mg/dL 118(H) 155(H) 100(H)  BUN 8 - 23 mg/dL 9 29(H) 26(H)  Creatinine 0.44 - 1.00 mg/dL 0.98 1.20(H) 1.01(H)  Sodium 135 - 145 mmol/L 137 138 141  Potassium 3.5 - 5.1 mmol/L 4.0 4.1 4.1  Chloride 98 - 111 mmol/L 102 105 111  CO2 22 - 32 mmol/L 27 23 21(L)  Calcium 8.9 - 10.3 mg/dL 8.4(L) 8.8(L) 9.1  Total Protein 6.5 - 8.1 g/dL 7.1 - -  Total Bilirubin 0.3 - 1.2 mg/dL 0.3 - -  Alkaline Phos 38 - 126 U/L 73 - -  AST 15 - 41 U/L 21 - -  ALT 0 - 44 U/L 19 - -     RADIOGRAPHIC STUDIES: I have personally reviewed the radiological images as listed and agreed with the findings in the report. CT ABDOMEN PELVIS W CONTRAST  Result Date: 06/15/2020 CLINICAL DATA:  Anemia.  Abdominal pain, vomiting. EXAM: CT ABDOMEN AND PELVIS WITH CONTRAST TECHNIQUE: Multidetector CT imaging of the abdomen and pelvis was performed using the standard protocol following bolus administration of intravenous contrast. CONTRAST:  26mL ISOVUE-370 IOPAMIDOL (ISOVUE-370) INJECTION 76% COMPARISON:  None. FINDINGS: Lower chest: No acute abnormality. Calcified granuloma noted in the right middle lobe. Hepatobiliary: No focal liver abnormalities identified. Gallbladder appears partially collapsed. There is enhancement of the gallbladder wall and mild pericholecystic soft tissue stranding. No stones or gallbladder sludge  identified. No bile duct dilatation. Pancreas: Unremarkable. No pancreatic ductal dilatation or surrounding inflammatory changes. Spleen: Multiple calcified granulomas identified within the spleen. Adrenals/Urinary Tract: Normal appearance of the adrenal glands. Small, subcentimeter low-density foci within the left kidney are too small to reliably characterize. No suspicious enhancing kidney lesion identified. No hydronephrosis identified bilaterally. The urinary  bladder is unremarkable. Stomach/Bowel: Moderate hiatal hernia. No bowel wall thickening, inflammation, or distension identified. A few scattered distal colonic diverticula noted without signs of acute inflammation Vascular/Lymphatic: Aortic atherosclerosis. No aneurysm. No abdominal adenopathy. No pelvic or inguinal adenopathy identified. Reproductive: Uterus and bilateral adnexa are unremarkable. Other: None Musculoskeletal: No acute or suspicious osseous findings. Bilateral L5 pars defects noted. First degree anterolisthesis of L4 on L5 and L5 on S1 noted along with L4-5 and L5-S1 degenerative disc disease. IMPRESSION: 1. There is mild enhancement of the gallbladder wall along with mild pericholecystic soft tissue stranding. No stones or gallbladder sludge identified. If there are clinical signs or symptoms concerning for cholecystitis recommend further investigation with gallbladder sonogram. 2. Hiatal hernia 3. 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 4. Aortic atherosclerosis. Aortic Atherosclerosis (ICD10-I70.0). These results will be called to the ordering clinician or representative by the Radiologist Assistant, and communication documented in the PACS or [employer]. Electronically Signed   By: Kerby Moors M.D.   On: 06/15/2020 14:53    ASSESSMENT & PLAN: 74 yo female   1. Recurrent anemia, h/o iron deficiency  -we reviewed her outside records and recent labs in great detail with the patient.  She had a normal CBC in 2018, developed severe IDA and DVT in 12/2018 after respiratory illness treated with antibiotics and steroids. She received blood transfusion and IV Feraheme (did not tolerate well, GI upset). She has had episodic anemia that typically resolves after these infectious episodes until now -she developed moderate anemia Hg 8-9 in 04/2020 after URI and ear infection s/p antibiotics and 2 courses of steroids, no overt bleeding -Repeat labs from 06/28/20 show normal B12, folate and ferritin, low serum iron and TIBC with Hg 8.8. overall labs are suggestive of anemia of chronic disease likely with a component of IDA from slow microscopic GI bleeding and possibly from hiatal hernia, although no active bleeding on EGD/colonoscopy. Has not had capsule endoscopy   -she is on PPI which we discussed can inhibit iron absorption -She has been on oral iron 150 mg once daily for 2 months which has not improved her anemia. She will try to take BID with vitamin C for better absorption -we are recommending IV Venofer 200 mg twice weekly x4-5 treatments, then lab and f/u in 4-6 weeks to see if her anemia improves -if Hg does not improve after sufficient iron replacement, she may require additional work up for her anemia   2. H/o posterior tibial DVT -Onset 12/2018 during infection and acute severe anemia Hg 5.1 -S/p IVF filter which was retrieved in 08/2019  -on ASA 81 mg until she developed recurrent anemia in 04/2020, not on anticoagulation  -no signs of recurrent thrombosis   3. HTN, HL, migraines -continue med regimen and f/u with PCP   4. H/o AKI during 12/2018 hospitalization for anemia, DVT -resolved   PLAN: -medical record, endoscopies, labs reviewed  -repeat labs from consult reviewed  -increase oral iron 150 mg BID, take with vitamin C for better absorption -proceed with IV Venofer 200 mg twice weekly x4-5 treatments -lab, f/u in 5-6 weeks   All questions were answered. The patient  knows to call the clinic with any problems, questions or concerns.     [name], NP 06/30/20   Addendum  I have seen the patient, examined her. I agree with the assessment and and plan and have edited the notes.   Ms  Donica had history of iron deficient anemia from GI bleeding in April 2020, which required hospitalization.  Anemia resolved after IV iron, she did have GI endoscopy after that episode and we are trying to get the records from her GI.  She started having anemia again about 2 months ago, recently ferritin was normal, no overt GI bleeding. I will repeat CBC, iron+TIBC, ferritin, B12, folate to rule out nutritional anemia.  I have low threshold to give her intravenous IV due to her no history of iron deficient anemia and a good response to IV iron.  She did not tolerate Feraheme well, will change to Venofer this time.  If her anemia does not resolve after IV iron, we may need additional anemia work-up.  All questions were answered.  [name] MD 06/29/2020

## 2020-06-29 LAB — IRON AND TIBC
Iron: 13 ug/dL — ABNORMAL LOW (ref 41–142)
Saturation Ratios: 6 % — ABNORMAL LOW (ref 21–57)
TIBC: 228 ug/dL — ABNORMAL LOW (ref 236–444)
UIBC: 214 ug/dL (ref 120–384)

## 2020-06-29 LAB — FERRITIN: Ferritin: 172 ng/mL (ref 11–307)

## 2020-06-30 ENCOUNTER — Encounter: Payer: Self-pay | Admitting: Nurse Practitioner

## 2020-07-03 ENCOUNTER — Telehealth: Payer: Self-pay

## 2020-07-03 NOTE — Telephone Encounter (Signed)
Returned patient's call about Iron dose and iron infusion. Per Lacie NP patient can take Iron 150mg  BID with Vitamin C. Lacie NP has sent a message to scheduling to have iron infusion scheduled. Patient verbalized understanding.

## 2020-07-04 ENCOUNTER — Telehealth: Payer: Self-pay | Admitting: Nurse Practitioner

## 2020-07-04 LAB — METHYLMALONIC ACID, SERUM: Methylmalonic Acid, Quantitative: 114 nmol/L (ref 0–378)

## 2020-07-04 NOTE — Telephone Encounter (Signed)
Scheduled appt per 10/1 sch msg - pt is aware of appts on 10/6 and 10/8- will get an updated schedule when she comes in tomorrow

## 2020-07-05 ENCOUNTER — Other Ambulatory Visit: Payer: Self-pay

## 2020-07-05 ENCOUNTER — Inpatient Hospital Stay: Payer: Medicare Other | Attending: Nurse Practitioner

## 2020-07-05 VITALS — BP 138/69 | HR 78 | Temp 98.6°F | Resp 18

## 2020-07-05 DIAGNOSIS — D649 Anemia, unspecified: Secondary | ICD-10-CM | POA: Diagnosis present

## 2020-07-05 DIAGNOSIS — D509 Iron deficiency anemia, unspecified: Secondary | ICD-10-CM | POA: Diagnosis not present

## 2020-07-05 MED ORDER — SODIUM CHLORIDE 0.9 % IV SOLN
Freq: Once | INTRAVENOUS | Status: AC
  Filled 2020-07-05: qty 250

## 2020-07-05 MED ORDER — LORATADINE 10 MG PO TABS
10.0000 mg | ORAL_TABLET | Freq: Once | ORAL | Status: AC
  Administered 2020-07-05: 10 mg via ORAL

## 2020-07-05 MED ORDER — LORATADINE 10 MG PO TABS
ORAL_TABLET | ORAL | Status: AC
  Filled 2020-07-05: qty 1

## 2020-07-05 MED ORDER — SODIUM CHLORIDE 0.9 % IV SOLN
200.0000 mg | Freq: Once | INTRAVENOUS | Status: AC
  Administered 2020-07-05: 200 mg via INTRAVENOUS
  Filled 2020-07-05: qty 200

## 2020-07-05 NOTE — Patient Instructions (Signed)

## 2020-07-06 ENCOUNTER — Other Ambulatory Visit: Payer: Self-pay

## 2020-07-07 ENCOUNTER — Inpatient Hospital Stay: Payer: Medicare Other

## 2020-07-07 ENCOUNTER — Other Ambulatory Visit: Payer: Self-pay

## 2020-07-07 VITALS — BP 130/64 | HR 92 | Temp 98.2°F | Resp 18

## 2020-07-07 DIAGNOSIS — D509 Iron deficiency anemia, unspecified: Secondary | ICD-10-CM

## 2020-07-07 MED ORDER — LORATADINE 10 MG PO TABS
10.0000 mg | ORAL_TABLET | Freq: Once | ORAL | Status: AC
  Administered 2020-07-07: 10 mg via ORAL

## 2020-07-07 MED ORDER — SODIUM CHLORIDE 0.9 % IV SOLN
Freq: Once | INTRAVENOUS | Status: AC
  Filled 2020-07-07: qty 250

## 2020-07-07 MED ORDER — SODIUM CHLORIDE 0.9 % IV SOLN
200.0000 mg | Freq: Once | INTRAVENOUS | Status: AC
  Administered 2020-07-07: 200 mg via INTRAVENOUS
  Filled 2020-07-07: qty 200

## 2020-07-07 MED ORDER — LORATADINE 10 MG PO TABS
ORAL_TABLET | ORAL | Status: AC
  Filled 2020-07-07: qty 1

## 2020-07-07 NOTE — Patient Instructions (Signed)

## 2020-07-12 ENCOUNTER — Other Ambulatory Visit: Payer: Self-pay

## 2020-07-12 ENCOUNTER — Inpatient Hospital Stay: Payer: Medicare Other

## 2020-07-12 VITALS — BP 127/71 | HR 71 | Temp 98.2°F | Resp 17 | Ht 68.0 in

## 2020-07-12 DIAGNOSIS — D509 Iron deficiency anemia, unspecified: Secondary | ICD-10-CM | POA: Diagnosis not present

## 2020-07-12 MED ORDER — LORATADINE 10 MG PO TABS
10.0000 mg | ORAL_TABLET | Freq: Once | ORAL | Status: AC
  Administered 2020-07-12: 10 mg via ORAL

## 2020-07-12 MED ORDER — SODIUM CHLORIDE 0.9 % IV SOLN
Freq: Once | INTRAVENOUS | Status: AC
  Filled 2020-07-12: qty 250

## 2020-07-12 MED ORDER — SODIUM CHLORIDE 0.9 % IV SOLN
200.0000 mg | Freq: Once | INTRAVENOUS | Status: AC
  Administered 2020-07-12: 200 mg via INTRAVENOUS
  Filled 2020-07-12: qty 200

## 2020-07-12 MED ORDER — LORATADINE 10 MG PO TABS
ORAL_TABLET | ORAL | Status: AC
  Filled 2020-07-12: qty 1

## 2020-07-12 NOTE — Patient Instructions (Signed)

## 2020-07-14 ENCOUNTER — Other Ambulatory Visit: Payer: Self-pay

## 2020-07-14 ENCOUNTER — Inpatient Hospital Stay: Payer: Medicare Other

## 2020-07-14 VITALS — BP 139/74 | HR 73 | Temp 98.7°F | Resp 17

## 2020-07-14 DIAGNOSIS — D509 Iron deficiency anemia, unspecified: Secondary | ICD-10-CM | POA: Diagnosis not present

## 2020-07-14 MED ORDER — SODIUM CHLORIDE 0.9 % IV SOLN
Freq: Once | INTRAVENOUS | Status: AC
  Filled 2020-07-14: qty 250

## 2020-07-14 MED ORDER — LORATADINE 10 MG PO TABS
10.0000 mg | ORAL_TABLET | Freq: Once | ORAL | Status: AC
  Administered 2020-07-14: 10 mg via ORAL

## 2020-07-14 MED ORDER — LORATADINE 10 MG PO TABS
ORAL_TABLET | ORAL | Status: AC
  Filled 2020-07-14: qty 1

## 2020-07-14 MED ORDER — SODIUM CHLORIDE 0.9 % IV SOLN
200.0000 mg | Freq: Once | INTRAVENOUS | Status: AC
  Administered 2020-07-14: 200 mg via INTRAVENOUS
  Filled 2020-07-14: qty 200

## 2020-07-14 NOTE — Patient Instructions (Signed)

## 2020-07-17 ENCOUNTER — Ambulatory Visit
Admission: RE | Admit: 2020-07-17 | Discharge: 2020-07-17 | Disposition: A | Discharge status: Home / Self Care | Payer: Medicare Other | Source: Ambulatory Visit | Attending: Family Medicine | Admitting: Family Medicine

## 2020-07-17 ENCOUNTER — Other Ambulatory Visit: Payer: Self-pay

## 2020-07-17 DIAGNOSIS — Z1231 Encounter for screening mammogram for malignant neoplasm of breast: Secondary | ICD-10-CM | POA: Diagnosis not present

## 2020-07-20 ENCOUNTER — Other Ambulatory Visit: Payer: Self-pay | Admitting: Family Medicine

## 2020-07-20 DIAGNOSIS — R928 Other abnormal and inconclusive findings on diagnostic imaging of breast: Secondary | ICD-10-CM

## 2020-08-02 ENCOUNTER — Other Ambulatory Visit: Payer: Self-pay

## 2020-08-02 DIAGNOSIS — D509 Iron deficiency anemia, unspecified: Secondary | ICD-10-CM

## 2020-08-03 ENCOUNTER — Other Ambulatory Visit: Payer: Self-pay | Admitting: Nurse Practitioner

## 2020-08-03 ENCOUNTER — Inpatient Hospital Stay: Payer: Medicare Other

## 2020-08-03 ENCOUNTER — Inpatient Hospital Stay: Payer: Medicare Other | Attending: Nurse Practitioner | Admitting: Nurse Practitioner

## 2020-08-03 ENCOUNTER — Other Ambulatory Visit: Payer: Self-pay

## 2020-08-03 ENCOUNTER — Encounter: Payer: Self-pay | Admitting: Nurse Practitioner

## 2020-08-03 VITALS — BP 134/69 | HR 78 | Temp 98.3°F | Resp 16 | Ht 68.0 in | Wt 162.0 lb

## 2020-08-03 DIAGNOSIS — Z79899 Other long term (current) drug therapy: Secondary | ICD-10-CM | POA: Diagnosis not present

## 2020-08-03 DIAGNOSIS — D649 Anemia, unspecified: Secondary | ICD-10-CM

## 2020-08-03 DIAGNOSIS — D509 Iron deficiency anemia, unspecified: Secondary | ICD-10-CM

## 2020-08-03 DIAGNOSIS — Z86718 Personal history of other venous thrombosis and embolism: Secondary | ICD-10-CM | POA: Insufficient documentation

## 2020-08-03 DIAGNOSIS — I10 Essential (primary) hypertension: Secondary | ICD-10-CM | POA: Insufficient documentation

## 2020-08-03 DIAGNOSIS — D638 Anemia in other chronic diseases classified elsewhere: Secondary | ICD-10-CM | POA: Insufficient documentation

## 2020-08-03 LAB — CBC WITH DIFFERENTIAL (CANCER CENTER ONLY)
Abs Immature Granulocytes: 0.06 10*3/uL (ref 0.00–0.07)
Basophils Absolute: 0.1 10*3/uL (ref 0.0–0.1)
Basophils Relative: 1 %
Eosinophils Absolute: 0.2 10*3/uL (ref 0.0–0.5)
Eosinophils Relative: 2 %
HCT: 32.2 % — ABNORMAL LOW (ref 36.0–46.0)
Hemoglobin: 9.8 g/dL — ABNORMAL LOW (ref 12.0–15.0)
Immature Granulocytes: 1 %
Lymphocytes Relative: 7 %
Lymphs Abs: 0.7 10*3/uL (ref 0.7–4.0)
MCH: 25.5 pg — ABNORMAL LOW (ref 26.0–34.0)
MCHC: 30.4 g/dL (ref 30.0–36.0)
MCV: 83.6 fL (ref 80.0–100.0)
Monocytes Absolute: 0.8 10*3/uL (ref 0.1–1.0)
Monocytes Relative: 8 %
Neutro Abs: 8.4 10*3/uL — ABNORMAL HIGH (ref 1.7–7.7)
Neutrophils Relative %: 81 %
Platelet Count: 417 10*3/uL — ABNORMAL HIGH (ref 150–400)
RBC: 3.85 MIL/uL — ABNORMAL LOW (ref 3.87–5.11)
RDW: 20.3 % — ABNORMAL HIGH (ref 11.5–15.5)
WBC Count: 10.3 10*3/uL (ref 4.0–10.5)
nRBC: 0 % (ref 0.0–0.2)

## 2020-08-03 LAB — IRON AND TIBC
Iron: 47 ug/dL (ref 41–142)
Saturation Ratios: 24 % (ref 21–57)
TIBC: 194 ug/dL — ABNORMAL LOW (ref 236–444)
UIBC: 147 ug/dL (ref 120–384)

## 2020-08-03 LAB — FERRITIN: Ferritin: 1260 ng/mL — ABNORMAL HIGH (ref 11–307)

## 2020-08-03 MED ORDER — FERROUS SULFATE 325 (65 FE) MG PO TABS
325.0000 mg | ORAL_TABLET | Freq: Every day | ORAL | 1 refills | Status: DC
Start: 2020-08-03 — End: 2020-12-08

## 2020-08-03 NOTE — Progress Notes (Signed)
[place]   Telephone:(336) [phone] Fax:(336) [phone]   Clinic Follow up Note   Patient Care Team: Christa See, FNP as PCP - General (Family Medicine) [name], NP as Nurse Practitioner (Nurse Practitioner) [name], MD as Consulting Physician (Hematology) [name], MD as Consulting Physician (Gastroenterology) 08/03/2020  CHIEF COMPLAINT: F/u anemia and h/o iron deficiency   CURRENT THERAPY: po ferrous sulfate, IV Venofer PRN  INTERVAL HISTORY: Ms. Rebecca Chen returns for anemia f/u as scheduled. She presents in [facility] office, I am in my home office due to [phone] protocols that do not pertain to her health status.   She is alone. She was last seen 06/28/20 in initial consult. She received 4 doses of IV venofer. Tolerated infusions well. She continues oral iron once daily with vit C, had to start lower OTC dose because she ran out of ferrous sulfate. After IV iron she notes some improvement in color of her lips and feels warmer. She is still tired, runs out of stamina easily. Stools are dark on oral iron, no overt bleeding. Stopped PPI, her stomach is "more comfortable," no s/sx of recurrent gastritis. She has lost 8 lbs she attributes to lower intake since eating too much upsets her stomach. Denies recent pain, fever, chills, infection, use of steroids. She had abnormal mammogram and has diagnostic mammo/US on 11/9. She is UTD on flu vaccine, covid booster scheduled for next week.    MEDICAL HISTORY:  Past Medical History:  Diagnosis Date  . AKI (acute kidney injury) ([place]) 01/01/2019  . Hypertension     SURGICAL HISTORY: Past Surgical History:  Procedure Laterality Date  . FOOT SURGERY    . IR IVC FILTER PLMT / S&I /IMG GUID/MOD SED  01/01/2019  . IR IVC FILTER RETRIEVAL / S&I /IMG GUID/MOD SED  08/09/2019  . IR RADIOLOGIST EVAL & MGMT  07/21/2019    I have reviewed the social history and family history with the patient and they are unchanged from previous  note.  ALLERGIES:  is allergic to mixed vespid venom and bee venom.  MEDICATIONS:  Current Outpatient Medications  Medication Sig Dispense Refill  . Ascorbic Acid (VITAMIN C PO) Take 1 tablet by mouth daily. (Patient not taking: Reported on 06/28/2020)    . atenolol (TENORMIN) 50 MG tablet Take 50 mg by mouth daily.    [name] CALCIUM PO Take 1 tablet by mouth daily. (Patient not taking: Reported on 06/28/2020)    . CRANBERRY PO Take 1 tablet by mouth daily. (Patient not taking: Reported on 06/28/2020)    . Cyanocobalamin (VITAMIN B-12 PO) Take 1 tablet by mouth daily. (Patient not taking: Reported on 06/28/2020)    . EPINEPHrine (EPIPEN 2-PAK) 0.3 mg/0.3 mL IJ SOAJ injection Inject 0.3 mLs (0.3 mg total) into the muscle as needed for anaphylaxis. 1 each 0  . ferrous sulfate 325 (65 FE) MG tablet Take 325 mg by mouth daily with breakfast.    . NIACIN PO Take 1 tablet by mouth daily. (Patient not taking: Reported on 06/28/2020)    . pantoprazole (PROTONIX) 40 MG tablet Take 1 tablet (40 mg total) by mouth daily. 30 tablet 0  . predniSONE (DELTASONE) 10 MG tablet Take 2 tablets (20 mg total) by mouth 2 (two) times daily with a meal. (Patient not taking: Reported on 06/28/2020) 12 tablet 0  . Pyridoxine HCl (VITAMIN B-6 PO) Take 1 tablet by mouth daily. (Patient not taking: Reported on 06/28/2020)    . simvastatin (ZOCOR) 40  MG tablet Take 40 mg by mouth every evening.     [name] VITAMIN D PO Take 1 tablet by mouth daily.    [name] VITAMIN E PO Take 1 tablet by mouth daily. (Patient not taking: Reported on 06/28/2020)    . zolpidem (AMBIEN) 10 MG tablet Take 10 mg by mouth at bedtime as needed for sleep.      No current facility-administered medications for this visit.    PHYSICAL EXAMINATION: ECOG PERFORMANCE STATUS: 1-2   Vitals:   08/03/20 1147  BP: 134/69  Pulse: 78  Resp: 16  Temp: 98.3 F (36.8 C)  SpO2: 100%   Filed Weights   08/03/20 1147  Weight: 162 lb (73.5 kg)    GENERAL:alert, no  distress and comfortable SKIN: skin color normal  LUNGS: normal breathing effort NEURO: alert & oriented x 3 with fluent speech, no focal motor/sensory deficits Exam limited to observation during virtual/video visit    LABORATORY DATA:  I have reviewed the data as listed CBC Latest Ref Rng & Units 08/03/2020 06/28/2020 06/08/2020  WBC 4.0 - 10.5 K/uL 10.3 12.1(H) 13.9(H)  Hemoglobin 12.0 - 15.0 g/dL 9.8(L) 8.8(L) 8.6(L)  Hematocrit 36 - 46 % 32.2(L) 29.2(L) 28.8(L)  Platelets 150 - 400 K/uL 417(H) 484(H) 524(H)     CMP Latest Ref Rng & Units 06/08/2020 08/12/2019 08/09/2019  Glucose 70 - 99 mg/dL 118(H) 155(H) 100(H)  BUN 8 - 23 mg/dL 9 29(H) 26(H)  Creatinine 0.44 - 1.00 mg/dL 0.98 1.20(H) 1.01(H)  Sodium 135 - 145 mmol/L 137 138 141  Potassium 3.5 - 5.1 mmol/L 4.0 4.1 4.1  Chloride 98 - 111 mmol/L 102 105 111  CO2 22 - 32 mmol/L 27 23 21(L)  Calcium 8.9 - 10.3 mg/dL 8.4(L) 8.8(L) 9.1  Total Protein 6.5 - 8.1 g/dL 7.1 - -  Total Bilirubin 0.3 - 1.2 mg/dL 0.3 - -  Alkaline Phos 38 - 126 U/L 73 - -  AST 15 - 41 U/L 21 - -  ALT 0 - 44 U/L 19 - -      RADIOGRAPHIC STUDIES: I have personally reviewed the radiological images as listed and agreed with the findings in the report. No results found.   ASSESSMENT & PLAN: 74 yo female   1. Recurrent anemia, h/o iron deficiency  -She had a normal CBC in 2018, developed severe IDA and DVT in 12/2018 after respiratory illness treated with antibiotics and steroids. She received blood transfusion and IV Feraheme (did not tolerate well, GI upset). She has had episodic anemia that typically resolves after these infectious episodes until now -she developed moderate anemia Hg 8-9 in 04/2020 after URI and ear infection s/p antibiotics and 2 courses of steroids, no overt bleeding -Work up 06/28/20 show normal B12, folate and ferritin, low serum iron and TIBC with Hg 8.8. overall labs are suggestive of anemia of chronic disease likely with a component of  IDA from slow microscopic GI bleeding and possibly from hiatal hernia, although no active bleeding on EGD/colonoscopy. Has not had capsule endoscopy   -she was on PPI but holding since 05/2020 due to DDI which inhibits iron absorption -oral iron supplement 150 mg daily since 03/2020 did not improve her her anemia. Takes with vitamin C for better absorption -S/p IV Venofer 200 mg x4 doses 10/6, 10/8, 10/13, 10/15  -she tolerates IV and po iron well. She had some improvement in lip color and feeling warmer after iron, but remains very fatigued. Hg improved from 8.8 to 9.8 -  iron panel today is normal, except low TIBC and elevated ferritin now s/p IV iron replacement. She had partial response but anemia has not resolved.  - I recommend to proceed with further anemia work up   2. H/o posterior tibial DVT -Onset 12/2018 during infection and acute severe anemia Hg 5.1 -S/p IVF filter which was retrieved in 08/2019  -on ASA 81 mg until she developed recurrent anemia in 04/2020, not on anticoagulation  -no signs of recurrent thrombosis   3. HTN, HL, migraines -continue med regimen and f/u with PCP   4. H/o AKI during 12/2018 hospitalization for anemia, DVT -resolved   Disposition:  Ms. Crothers appears improved. She tolerated IV iron and continues oral iron. We reviewed her CBC, Hg 9.8 today (from 8.8 on 9/29), and Plt 417 (from 484) after 4 doses IV Venofer. She had a partial response to IV iron, however anemia did not resolve. Her thrombocytosis is likely reactive to anemia.   We discussed other etiologies. She may have a component of anemia of chronic disease, although she does not have the typical co-morbidities associated with this type of anemia. I recommend to check SPEP, heavy metals/copper, r/o hemolysis, and obtain US to r/o splenomegaly.   She had abnormal mammogram that requires diagnostic mammo/US scheduled on 11/9, with associated 8 lbs weight loss in 5 weeks. We discussed that occult  malignancy can cause anemia and thrombocytosis as well. We will follow her work up. May need further imaging pending mammo/breast US.   Hold bone marrow biopsy for now. If the above are unrevealing she agrees to proceed if needed to r/o MDS/MPN or other primary bone marrow disease.    F/u after work up with results.   Orders Placed This Encounter  Procedures  . US Abdomen Complete    Standing Status:   Future    Standing Expiration Date:   08/03/2021    Order Specific Question:   Reason for Exam (SYMPTOM  OR DIAGNOSIS REQUIRED)    Answer:   anemia and thrombocytosis, evaluate liver and spleen    Order Specific Question:   Preferred imaging location?    Answer:   [facility]    Standing Status:   Standing    Number of Occurrences:   1    Standing Expiration Date:   08/03/2021  . Technologist smear review    Standing Status:   Standing    Number of Occurrences:   1    Standing Expiration Date:   08/03/2021  . Lactate dehydrogenase (LDH)    Standing Status:   Standing    Number of Occurrences:   1    Standing Expiration Date:   08/03/2021  . Haptoglobin    Standing Status:   Standing    Number of Occurrences:   1    Standing Expiration Date:   08/03/2021  . Heavy metals, blood    Standing Status:   Standing    Number of Occurrences:   1    Standing Expiration Date:   08/03/2021    Order Specific Question:   Hispanic Heritage?    Answer:   Non-Hispanic    Order Specific Question:   Blood lead type?    Answer:   Venous    Order Specific Question:   Blood lead purpose?    Answer:   Initial    Order Specific Question:   [place] of residence?    Answer:   [place] [[id number]]  . Copper,  serum    Standing Status:   Standing    Number of Occurrences:   1    Standing Expiration Date:   08/03/2021  . SPEP with reflex to IFE    Standing Status:   Standing    Number of Occurrences:   1    Standing Expiration Date:   08/03/2021  . Kappa/lambda light chains    Standing  Status:   Future    Standing Expiration Date:   08/03/2021   All questions were answered. The patient knows to call the clinic with any problems, questions or concerns. No barriers to learning were detected. Total encounter time was 30 minutes.      [name], NP 08/03/20

## 2020-08-04 ENCOUNTER — Telehealth: Payer: Self-pay | Admitting: Nurse Practitioner

## 2020-08-04 NOTE — Telephone Encounter (Signed)
Scheduled appt per 11/5 sch msg - left message for patient with appt date and time

## 2020-08-08 ENCOUNTER — Ambulatory Visit
Admission: RE | Admit: 2020-08-08 | Discharge: 2020-08-08 | Disposition: A | Discharge status: Home / Self Care | Payer: Medicare Other | Source: Ambulatory Visit | Attending: Family Medicine | Admitting: Family Medicine

## 2020-08-08 ENCOUNTER — Ambulatory Visit (HOSPITAL_COMMUNITY): Payer: Medicare Other

## 2020-08-08 ENCOUNTER — Other Ambulatory Visit: Payer: Self-pay

## 2020-08-08 DIAGNOSIS — R928 Other abnormal and inconclusive findings on diagnostic imaging of breast: Secondary | ICD-10-CM

## 2020-08-08 DIAGNOSIS — N6489 Other specified disorders of breast: Secondary | ICD-10-CM | POA: Diagnosis not present

## 2020-08-08 DIAGNOSIS — R922 Inconclusive mammogram: Secondary | ICD-10-CM | POA: Diagnosis not present

## 2020-08-08 DIAGNOSIS — N6322 Unspecified lump in the left breast, upper inner quadrant: Secondary | ICD-10-CM | POA: Diagnosis not present

## 2020-08-09 ENCOUNTER — Ambulatory Visit: Payer: Medicare Other | Attending: Internal Medicine

## 2020-08-09 ENCOUNTER — Other Ambulatory Visit (HOSPITAL_COMMUNITY): Payer: Self-pay | Admitting: Internal Medicine

## 2020-08-09 DIAGNOSIS — Z23 Encounter for immunization: Secondary | ICD-10-CM

## 2020-08-09 NOTE — Progress Notes (Signed)
° °  Covid-19 Vaccination Clinic  Name:  Rebecca Chen    MRN: [id number] DOB: 13-Mar-1946  08/09/2020  [name] was observed post Covid-19 immunization for 15 minutes without incident. She was provided with Vaccine Information Sheet and instruction to access the V-Safe system.   [name] was instructed to call 911 with any severe reactions post vaccine:  Difficulty breathing   Swelling of face and throat   A fast heartbeat   A bad rash all over body   Dizziness and weakness

## 2020-08-10 ENCOUNTER — Ambulatory Visit (HOSPITAL_COMMUNITY): Payer: Medicare Other

## 2020-08-10 ENCOUNTER — Other Ambulatory Visit: Payer: Medicare Other

## 2020-08-11 ENCOUNTER — Other Ambulatory Visit: Payer: Self-pay

## 2020-08-11 ENCOUNTER — Inpatient Hospital Stay: Payer: Medicare Other

## 2020-08-11 ENCOUNTER — Ambulatory Visit (HOSPITAL_COMMUNITY)
Admission: RE | Admit: 2020-08-11 | Discharge: 2020-08-11 | Disposition: A | Discharge status: Home / Self Care | Payer: Medicare Other | Source: Ambulatory Visit | Attending: Nurse Practitioner | Admitting: Nurse Practitioner

## 2020-08-11 DIAGNOSIS — D696 Thrombocytopenia, unspecified: Secondary | ICD-10-CM | POA: Diagnosis not present

## 2020-08-11 DIAGNOSIS — Z79899 Other long term (current) drug therapy: Secondary | ICD-10-CM | POA: Diagnosis not present

## 2020-08-11 DIAGNOSIS — I10 Essential (primary) hypertension: Secondary | ICD-10-CM | POA: Diagnosis not present

## 2020-08-11 DIAGNOSIS — D649 Anemia, unspecified: Secondary | ICD-10-CM | POA: Insufficient documentation

## 2020-08-11 DIAGNOSIS — Z86718 Personal history of other venous thrombosis and embolism: Secondary | ICD-10-CM | POA: Diagnosis not present

## 2020-08-11 DIAGNOSIS — D638 Anemia in other chronic diseases classified elsewhere: Secondary | ICD-10-CM | POA: Diagnosis not present

## 2020-08-11 DIAGNOSIS — D509 Iron deficiency anemia, unspecified: Secondary | ICD-10-CM

## 2020-08-11 LAB — IRON AND TIBC
Iron: 35 ug/dL — ABNORMAL LOW (ref 41–142)
Saturation Ratios: 16 % — ABNORMAL LOW (ref 21–57)
TIBC: 211 ug/dL — ABNORMAL LOW (ref 236–444)
UIBC: 177 ug/dL (ref 120–384)

## 2020-08-11 LAB — RETIC PANEL
Immature Retic Fract: 16.6 % — ABNORMAL HIGH (ref 2.3–15.9)
RBC.: 3.82 MIL/uL — ABNORMAL LOW (ref 3.87–5.11)
Retic Count, Absolute: 52 10*3/uL (ref 19.0–186.0)
Retic Ct Pct: 1.4 % (ref 0.4–3.1)
Reticulocyte Hemoglobin: 31.4 pg

## 2020-08-11 LAB — CBC WITH DIFFERENTIAL (CANCER CENTER ONLY)
Abs Immature Granulocytes: 0.03 10*3/uL (ref 0.00–0.07)
Basophils Absolute: 0 10*3/uL (ref 0.0–0.1)
Basophils Relative: 1 %
Eosinophils Absolute: 0.4 10*3/uL (ref 0.0–0.5)
Eosinophils Relative: 5 %
HCT: 33.1 % — ABNORMAL LOW (ref 36.0–46.0)
Hemoglobin: 9.9 g/dL — ABNORMAL LOW (ref 12.0–15.0)
Immature Granulocytes: 0 %
Lymphocytes Relative: 10 %
Lymphs Abs: 0.8 10*3/uL (ref 0.7–4.0)
MCH: 25.6 pg — ABNORMAL LOW (ref 26.0–34.0)
MCHC: 29.9 g/dL — ABNORMAL LOW (ref 30.0–36.0)
MCV: 85.5 fL (ref 80.0–100.0)
Monocytes Absolute: 0.6 10*3/uL (ref 0.1–1.0)
Monocytes Relative: 8 %
Neutro Abs: 5.8 10*3/uL (ref 1.7–7.7)
Neutrophils Relative %: 76 %
Platelet Count: 416 10*3/uL — ABNORMAL HIGH (ref 150–400)
RBC: 3.87 MIL/uL (ref 3.87–5.11)
RDW: 20.6 % — ABNORMAL HIGH (ref 11.5–15.5)
WBC Count: 7.6 10*3/uL (ref 4.0–10.5)
nRBC: 0 % (ref 0.0–0.2)

## 2020-08-11 LAB — FERRITIN: Ferritin: 650 ng/mL — ABNORMAL HIGH (ref 11–307)

## 2020-08-11 LAB — TECHNOLOGIST SMEAR REVIEW

## 2020-08-11 LAB — LACTATE DEHYDROGENASE: LDH: 124 U/L (ref 98–192)

## 2020-08-12 LAB — HAPTOGLOBIN: Haptoglobin: 434 mg/dL — ABNORMAL HIGH (ref 42–346)

## 2020-08-14 LAB — KAPPA/LAMBDA LIGHT CHAINS
Kappa free light chain: 232.8 mg/L — ABNORMAL HIGH (ref 3.3–19.4)
Kappa, lambda light chain ratio: 3.29 — ABNORMAL HIGH (ref 0.26–1.65)
Lambda free light chains: 70.7 mg/L — ABNORMAL HIGH (ref 5.7–26.3)

## 2020-08-16 LAB — HEAVY METALS, BLOOD
Arsenic: 2 ug/L (ref 2–23)
Lead: <1 ug/dL (ref 0–4)
Mercury: 6.3 ug/L (ref 0.0–14.9)

## 2020-08-16 LAB — PROTEIN ELECTROPHORESIS, SERUM, WITH REFLEX
A/G Ratio: 0.5 — ABNORMAL LOW (ref 0.7–1.7)
Albumin ELP: 2.5 g/dL — ABNORMAL LOW (ref 2.9–4.4)
Alpha-1-Globulin: 0.4 g/dL (ref 0.0–0.4)
Alpha-2-Globulin: 1.1 g/dL — ABNORMAL HIGH (ref 0.4–1.0)
Beta Globulin: 1.7 g/dL — ABNORMAL HIGH (ref 0.7–1.3)
Gamma Globulin: 1.4 g/dL (ref 0.4–1.8)
Globulin, Total: 4.7 g/dL — ABNORMAL HIGH (ref 2.2–3.9)
M-Spike, %: 0.9 g/dL — ABNORMAL HIGH
SPEP Interpretation: 0
Total Protein ELP: 7.2 g/dL (ref 6.0–8.5)

## 2020-08-16 LAB — IMMUNOFIXATION REFLEX, SERUM
IgA: 346 mg/dL (ref 64–422)
IgG (Immunoglobin G), Serum: 2720 mg/dL — ABNORMAL HIGH (ref 586–1602)
IgM (Immunoglobulin M), Srm: 127 mg/dL (ref 26–217)

## 2020-08-16 LAB — COPPER, SERUM: Copper: 171 ug/dL — ABNORMAL HIGH (ref 80–158)

## 2020-08-17 ENCOUNTER — Other Ambulatory Visit: Payer: Self-pay

## 2020-08-17 ENCOUNTER — Inpatient Hospital Stay (HOSPITAL_BASED_OUTPATIENT_CLINIC_OR_DEPARTMENT_OTHER): Payer: Medicare Other | Admitting: Hematology

## 2020-08-17 ENCOUNTER — Encounter: Payer: Self-pay | Admitting: Hematology

## 2020-08-17 DIAGNOSIS — D509 Iron deficiency anemia, unspecified: Secondary | ICD-10-CM

## 2020-08-17 DIAGNOSIS — D638 Anemia in other chronic diseases classified elsewhere: Secondary | ICD-10-CM | POA: Diagnosis not present

## 2020-08-17 NOTE — Progress Notes (Signed)
[place]   Telephone:(336) [phone] Fax:(336) [phone]   Clinic Follow up Note   Patient Care Team: Christa See, FNP as PCP - General (Family Medicine) [name], NP as Nurse Practitioner (Nurse Practitioner) [name], MD as Consulting Physician (Hematology) [name], MD as Consulting Physician (Gastroenterology) 08/17/2020   I connected wit Doristine Counter on 08/16/2020 at  1:00 PM EST by telephone visit and verified that I am speaking with the correct person using two identifiers.  I discussed the limitations, risks, security and privacy concerns of performing an evaluation and management service by telephone and the availability of in person appointments. I also discussed with the patient that there may be a patient responsible charge related to this service. The patient expressed understanding and agreed to proceed.   Other persons participating in the visit and their role in the encounter:  None   Patient's location:  Home  Provider's location:  Office   CHIEF COMPLAINT: Follow-up lab results  CURRENT THERAPY: IV iron as needed  INTERVAL HISTORY: Patient is scheduled for a phone visit to discuss her reason the lab results.  She received IV Venofer a total of 800 mg last month, and it is slightly improved, no big difference after treatment.  She overall feels well, denies any pain, shortness of breath on exertion, or other new symptoms.  All other systems were reviewed with the patient and are negative.  MEDICAL HISTORY:  Past Medical History:  Diagnosis Date   AKI (acute kidney injury) ([place]) 01/01/2019   Hypertension     SURGICAL HISTORY: Past Surgical History:  Procedure Laterality Date   FOOT SURGERY     IR IVC FILTER PLMT / S&I /IMG GUID/MOD SED  01/01/2019   IR IVC FILTER RETRIEVAL / S&I /IMG GUID/MOD SED  08/09/2019   IR RADIOLOGIST EVAL & MGMT  07/21/2019    I have reviewed the social history and family history with the patient and  they are unchanged from previous note.  ALLERGIES:  is allergic to mixed vespid venom and bee venom.  MEDICATIONS:  Current Outpatient Medications  Medication Sig Dispense Refill   Ascorbic Acid (VITAMIN C PO) Take 1 tablet by mouth daily. (Patient not taking: Reported on 06/28/2020)     atenolol (TENORMIN) 50 MG tablet Take 50 mg by mouth daily.     CALCIUM PO Take 1 tablet by mouth daily. (Patient not taking: Reported on 06/28/2020)     CRANBERRY PO Take 1 tablet by mouth daily. (Patient not taking: Reported on 06/28/2020)     Cyanocobalamin (VITAMIN B-12 PO) Take 1 tablet by mouth daily. (Patient not taking: Reported on 06/28/2020)     EPINEPHrine (EPIPEN 2-PAK) 0.3 mg/0.3 mL IJ SOAJ injection Inject 0.3 mLs (0.3 mg total) into the muscle as needed for anaphylaxis. 1 each 0   ferrous sulfate 325 (65 FE) MG tablet Take 1 tablet (325 mg total) by mouth daily with breakfast. Take 1 tablet 1 to 2 times daily, with vitamin C 60 tablet 1   NIACIN PO Take 1 tablet by mouth daily. (Patient not taking: Reported on 06/28/2020)     pantoprazole (PROTONIX) 40 MG tablet Take 1 tablet (40 mg total) by mouth daily. 30 tablet 0   predniSONE (DELTASONE) 10 MG tablet Take 2 tablets (20 mg total) by mouth 2 (two) times daily with a meal. (Patient not taking: Reported on 06/28/2020) 12 tablet 0   Pyridoxine HCl (VITAMIN B-6 PO) Take 1 tablet by mouth  daily. (Patient not taking: Reported on 06/28/2020)     simvastatin (ZOCOR) 40 MG tablet Take 40 mg by mouth every evening.      VITAMIN D PO Take 1 tablet by mouth daily.     VITAMIN E PO Take 1 tablet by mouth daily. (Patient not taking: Reported on 06/28/2020)     zolpidem (AMBIEN) 10 MG tablet Take 10 mg by mouth at bedtime as needed for sleep.      No current facility-administered medications for this visit.    PHYSICAL EXAMINATION: ECOG PERFORMANCE STATUS: 0 - Asymptomatic   LABORATORY DATA:  I have reviewed the data as listed CBC Latest Ref  Rng & Units 08/11/2020 08/03/2020 06/28/2020  WBC 4.0 - 10.5 K/uL 7.6 10.3 12.1(H)  Hemoglobin 12.0 - 15.0 g/dL 9.9(L) 9.8(L) 8.8(L)  Hematocrit 36 - 46 % 33.1(L) 32.2(L) 29.2(L)  Platelets 150 - 400 K/uL 416(H) 417(H) 484(H)     CMP Latest Ref Rng & Units 06/08/2020 08/12/2019 08/09/2019  Glucose 70 - 99 mg/dL 118(H) 155(H) 100(H)  BUN 8 - 23 mg/dL 9 29(H) 26(H)  Creatinine 0.44 - 1.00 mg/dL 0.98 1.20(H) 1.01(H)  Sodium 135 - 145 mmol/L 137 138 141  Potassium 3.5 - 5.1 mmol/L 4.0 4.1 4.1  Chloride 98 - 111 mmol/L 102 105 111  CO2 22 - 32 mmol/L 27 23 21(L)  Calcium 8.9 - 10.3 mg/dL 8.4(L) 8.8(L) 9.1  Total Protein 6.5 - 8.1 g/dL 7.1 - -  Total Bilirubin 0.3 - 1.2 mg/dL 0.3 - -  Alkaline Phos 38 - 126 U/L 73 - -  AST 15 - 41 U/L 21 - -  ALT 0 - 44 U/L 19 - -      RADIOGRAPHIC STUDIES: I have personally reviewed the radiological images as listed and agreed with the findings in the report. No results found.   ASSESSMENT & PLAN: 74 yo female   1.  Recurrent anemia, history of iron deficient anemia, (+) M protein, rule out MM  -She previously had iron deficient anemia, and resolved after iron supplement in 2020 -She had a recurrent anemia in August 2021, after upper respiratory infection and antibiotics. -Lab reviewed, anemia slightly improved after 800 mg Venofer, with hemoglobin 9.9.  Repeated iron study after IV iron showed elevated ferritin, low TIBC and iron saturation, consistent with anemia of chronic disease -Other lab work was negative for nutritional anemia -SPEP showed positive M protein at the low level, 0.6g/dl, elevated kappa and lambda light chain level, which is nonspecific.  -No lab evidence of hemolysis -I recommend a bone marrow biopsy to rule out MDS and multiple myeloma, especially with positive serum M protein.  We discussed the logistics of procedure, complications, etc.  After lengthy discussion patient agrees to proceed bone marrow biopsy in our office after  Thanksgiving, will arrrange -Follow-up after bone marrow biopsy   All questions were answered. The patient knows to call the clinic with any problems, questions or concerns. No barriers to learning was detected. The total time spent in the appointment was 22 minutes and more than 50% was on counseling and review of test results     [name], MD 08/17/20

## 2020-08-21 ENCOUNTER — Telehealth: Payer: Self-pay

## 2020-08-21 ENCOUNTER — Other Ambulatory Visit: Payer: Self-pay

## 2020-08-21 DIAGNOSIS — D638 Anemia in other chronic diseases classified elsewhere: Secondary | ICD-10-CM

## 2020-08-21 DIAGNOSIS — D649 Anemia, unspecified: Secondary | ICD-10-CM

## 2020-08-21 DIAGNOSIS — D509 Iron deficiency anemia, unspecified: Secondary | ICD-10-CM

## 2020-08-21 NOTE — Telephone Encounter (Signed)
-----   Message from Gardenia Phlegm, NP sent at 08/18/2020  1:30 PM EST ----- That's fine.  Happy to help.  Sending to Mickel Baas to get scheduled :) ----- Message ----- From: [name], MD Sent: 08/17/2020   3:34 PM EST To: Gardenia Phlegm, NP, Royston Bake, RN  Mendel Ryder,  Are you able to do a BM biopsy for her in the next 2-3 weeks? She is not on any blood thinner. She may wants mild sedation before procedure.  Thanks much  Thanks   [employer]

## 2020-08-21 NOTE — Telephone Encounter (Signed)
This LPN contacted this pt to schedule BMBX and she has decided she would like a sedative for the procedure, and unfortunately we can not accommodate that in the [facility]. I explained to her that [place] would be happy to give her a one time benzodiazepine but she declined. Message sent to Dr Burr Medico and her nurse to accommodate appt.

## 2020-08-22 ENCOUNTER — Other Ambulatory Visit: Payer: Self-pay | Admitting: Hematology

## 2020-08-22 ENCOUNTER — Other Ambulatory Visit: Payer: Self-pay

## 2020-08-22 DIAGNOSIS — D649 Anemia, unspecified: Secondary | ICD-10-CM

## 2020-08-22 DIAGNOSIS — D509 Iron deficiency anemia, unspecified: Secondary | ICD-10-CM

## 2020-08-22 NOTE — Progress Notes (Signed)
Clarifying order

## 2020-08-30 ENCOUNTER — Other Ambulatory Visit: Payer: Self-pay | Admitting: Radiology

## 2020-08-31 ENCOUNTER — Other Ambulatory Visit: Payer: Self-pay | Admitting: Physician Assistant

## 2020-09-01 ENCOUNTER — Ambulatory Visit (HOSPITAL_COMMUNITY)
Admission: RE | Admit: 2020-09-01 | Discharge: 2020-09-01 | Disposition: A | Discharge status: Home / Self Care | Payer: Medicare Other | Source: Ambulatory Visit | Attending: Hematology | Admitting: Hematology

## 2020-09-01 ENCOUNTER — Other Ambulatory Visit: Payer: Self-pay

## 2020-09-01 ENCOUNTER — Encounter (HOSPITAL_COMMUNITY): Payer: Self-pay

## 2020-09-01 DIAGNOSIS — M549 Dorsalgia, unspecified: Secondary | ICD-10-CM | POA: Diagnosis not present

## 2020-09-01 DIAGNOSIS — D6489 Other specified anemias: Secondary | ICD-10-CM | POA: Diagnosis not present

## 2020-09-01 DIAGNOSIS — D649 Anemia, unspecified: Secondary | ICD-10-CM | POA: Insufficient documentation

## 2020-09-01 DIAGNOSIS — Z79899 Other long term (current) drug therapy: Secondary | ICD-10-CM | POA: Diagnosis not present

## 2020-09-01 DIAGNOSIS — R779 Abnormality of plasma protein, unspecified: Secondary | ICD-10-CM | POA: Diagnosis not present

## 2020-09-01 DIAGNOSIS — E8581 Light chain (AL) amyloidosis: Secondary | ICD-10-CM | POA: Diagnosis not present

## 2020-09-01 DIAGNOSIS — D72822 Plasmacytosis: Secondary | ICD-10-CM | POA: Insufficient documentation

## 2020-09-01 DIAGNOSIS — D509 Iron deficiency anemia, unspecified: Secondary | ICD-10-CM

## 2020-09-01 DIAGNOSIS — D472 Monoclonal gammopathy: Secondary | ICD-10-CM | POA: Diagnosis not present

## 2020-09-01 LAB — CBC WITH DIFFERENTIAL/PLATELET
Abs Immature Granulocytes: 0.03 10*3/uL (ref 0.00–0.07)
Basophils Absolute: 0 10*3/uL (ref 0.0–0.1)
Basophils Relative: 1 %
Eosinophils Absolute: 0.4 10*3/uL (ref 0.0–0.5)
Eosinophils Relative: 5 %
HCT: 33.5 % — ABNORMAL LOW (ref 36.0–46.0)
Hemoglobin: 10.2 g/dL — ABNORMAL LOW (ref 12.0–15.0)
Immature Granulocytes: 0 %
Lymphocytes Relative: 16 %
Lymphs Abs: 1.2 10*3/uL (ref 0.7–4.0)
MCH: 27.1 pg (ref 26.0–34.0)
MCHC: 30.4 g/dL (ref 30.0–36.0)
MCV: 89.1 fL (ref 80.0–100.0)
Monocytes Absolute: 0.7 10*3/uL (ref 0.1–1.0)
Monocytes Relative: 9 %
Neutro Abs: 5.3 10*3/uL (ref 1.7–7.7)
Neutrophils Relative %: 69 %
Platelets: 357 10*3/uL (ref 150–400)
RBC: 3.76 MIL/uL — ABNORMAL LOW (ref 3.87–5.11)
RDW: 20.3 % — ABNORMAL HIGH (ref 11.5–15.5)
WBC: 7.6 10*3/uL (ref 4.0–10.5)
nRBC: 0 % (ref 0.0–0.2)

## 2020-09-01 LAB — PROTIME-INR
INR: 1 (ref 0.8–1.2)
Prothrombin Time: 13 seconds (ref 11.4–15.2)

## 2020-09-01 MED ORDER — SODIUM CHLORIDE 0.9 % IV SOLN: INTRAVENOUS | Status: DC

## 2020-09-01 MED ORDER — NALOXONE HCL 0.4 MG/ML IJ SOLN
INTRAMUSCULAR | Status: AC
  Filled 2020-09-01: qty 1

## 2020-09-01 MED ORDER — LIDOCAINE HCL (PF) 1 % IJ SOLN
INTRAMUSCULAR | Status: AC | PRN
  Administered 2020-09-01: 10 mL via INTRADERMAL

## 2020-09-01 MED ORDER — FENTANYL CITRATE (PF) 100 MCG/2ML IJ SOLN
INTRAMUSCULAR | Status: AC | PRN
Start: 2020-09-01 — End: 2020-09-01
  Administered 2020-09-01 (×2): 50 ug via INTRAVENOUS

## 2020-09-01 MED ORDER — MIDAZOLAM HCL 2 MG/2ML IJ SOLN
INTRAMUSCULAR | Status: AC
  Filled 2020-09-01: qty 4

## 2020-09-01 MED ORDER — FLUMAZENIL 0.5 MG/5ML IV SOLN
INTRAVENOUS | Status: AC
  Filled 2020-09-01: qty 5

## 2020-09-01 MED ORDER — MIDAZOLAM HCL 2 MG/2ML IJ SOLN
INTRAMUSCULAR | Status: AC | PRN
  Administered 2020-09-01: 1 mg via INTRAVENOUS
  Administered 2020-09-01: 2 mg via INTRAVENOUS
  Administered 2020-09-01: 1 mg via INTRAVENOUS

## 2020-09-01 MED ORDER — FENTANYL CITRATE (PF) 100 MCG/2ML IJ SOLN
INTRAMUSCULAR | Status: AC
  Filled 2020-09-01: qty 2

## 2020-09-01 NOTE — Procedures (Signed)
Interventional Radiology Procedure Note  Procedure: CT guided bone marrow aspiration and biopsy  Complications: None  EBL: < 10 mL  Findings: Aspirate and core biopsy performed of bone marrow in right iliac bone.  Plan: Bedrest supine x 1 hrs  Orin Eberwein T. Adilen Pavelko, M.D Pager:  [phone]   

## 2020-09-01 NOTE — Discharge Instructions (Signed)
Please call Interventional Radiology clinic [phone] with any questions or concerns.  You may remove your dressing and shower tomorrow.   Bone Marrow Aspiration and Bone Marrow Biopsy, Adult, Care After This sheet gives you information about how to care for yourself after your procedure. Your health care provider may also give you more specific instructions. If you have problems or questions, contact your health care provider. What can I expect after the procedure? After the procedure, it is common to have:  Mild pain and tenderness.  Swelling.  Bruising. Follow these instructions at home: Puncture site care   Follow instructions from your health care provider about how to take care of the puncture site. Make sure you: ? Wash your hands with soap and water before and after you change your bandage (dressing). If soap and water are not available, use hand sanitizer. ? Change your dressing as told by your health care provider.  Check your puncture site every day for signs of infection. Check for: ? More redness, swelling, or pain. ? Fluid or blood. ? Warmth. ? Pus or a bad smell. Activity  Return to your normal activities as told by your health care provider. Ask your health care provider what activities are safe for you.  Do not lift anything that is heavier than 10 lb (4.5 kg), or the limit that you are told, until your health care provider says that it is safe.  Do not drive for 24 hours if you were given a sedative during your procedure. General instructions   Take over-the-counter and prescription medicines only as told by your health care provider.  Do not take baths, swim, or use a hot tub until your health care provider approves. Ask your health care provider if you may take showers. You may only be allowed to take sponge baths.  If directed, put ice on the affected area. To do this: ? Put ice in a plastic bag. ? Place a towel between your skin and the  bag. ? Leave the ice on for 20 minutes, 2-3 times a day.  Keep all follow-up visits as told by your health care provider. This is important. Contact a health care provider if:  Your pain is not controlled with medicine.  You have a fever.  You have more redness, swelling, or pain around the puncture site.  You have fluid or blood coming from the puncture site.  Your puncture site feels warm to the touch.  You have pus or a bad smell coming from the puncture site. Summary  After the procedure, it is common to have mild pain, tenderness, swelling, and bruising.  Follow instructions from your health care provider about how to take care of the puncture site and what activities are safe for you.  Take over-the-counter and prescription medicines only as told by your health care provider.  Contact a health care provider if you have any signs of infection, such as fluid or blood coming from the puncture site. This information is not intended to replace advice given to you by your health care provider. Make sure you discuss any questions you have with your health care provider. Document Revised: 02/02/2019 Document Reviewed: 02/02/2019 Elsevier Patient Education  2020 Elsevier Inc.   Moderate Conscious Sedation, Adult, Care After These instructions provide you with information about caring for yourself after your procedure. Your health care provider may also give you more specific instructions. Your treatment has been planned according to current medical practices, but problems sometimes occur. Call   your health care provider if you have any problems or questions after your procedure. What can I expect after the procedure? After your procedure, it is common:  To feel sleepy for several hours.  To feel clumsy and have poor balance for several hours.  To have poor judgment for several hours.  To vomit if you eat too soon. Follow these instructions at home: For at least 24 hours after  the procedure:   Do not: ? Participate in activities where you could fall or become injured. ? Drive. ? Use heavy machinery. ? Drink alcohol. ? Take sleeping pills or medicines that cause drowsiness. ? Make important decisions or sign legal documents. ? Take care of children on your own.  Rest. Eating and drinking  Follow the diet recommended by your health care provider.  If you vomit: ? Drink water, juice, or soup when you can drink without vomiting. ? Make sure you have little or no nausea before eating solid foods. General instructions  Have a responsible adult stay with you until you are awake and alert.  Take over-the-counter and prescription medicines only as told by your health care provider.  If you smoke, do not smoke without supervision.  Keep all follow-up visits as told by your health care provider. This is important. Contact a health care provider if:  You keep feeling nauseous or you keep vomiting.  You feel light-headed.  You develop a rash.  You have a fever. Get help right away if:  You have trouble breathing. This information is not intended to replace advice given to you by your health care provider. Make sure you discuss any questions you have with your health care provider. Document Revised: 08/29/2017 Document Reviewed: 01/06/2016 Elsevier Patient Education  2020 Elsevier Inc.  

## 2020-09-01 NOTE — H&P (Signed)
Referring Physician(s): Feng,Yan  Supervising Physician: Aletta Edouard  Patient Status:  [facility]  Chief Complaint: "I'm here for a bone marrow biopsy"   Subjective: Patient familiar to IR service  from IVC filter placement on 01/18/2019 followed by removal on 08/09/2019.  She has a history of recurrent anemia with M spike noted on SPEP as well as elevated kappa/ lambda light chains.  She presents today for CT-guided bone marrow biopsy for further evaluation.  She currently denies fever, headache, chest pain, dyspnea, cough, abdominal pain, nausea, vomiting or bleeding.  She does have some occasional back pain.  Past Medical History:  Diagnosis Date  . AKI (acute kidney injury) ([place]) 01/01/2019  . Hypertension    Past Surgical History:  Procedure Laterality Date  . FOOT SURGERY    . IR IVC FILTER PLMT / S&I /IMG GUID/MOD SED  01/01/2019  . IR IVC FILTER RETRIEVAL / S&I /IMG GUID/MOD SED  08/09/2019  . IR RADIOLOGIST EVAL & MGMT  07/21/2019      Allergies: Mixed vespid venom and Bee venom  Medications: Prior to Admission medications   Medication Sig Start Date End Date Taking? Authorizing Provider  Ascorbic Acid (VITAMIN C PO) Take 1 tablet by mouth daily.    Yes [provider]  atenolol (TENORMIN) 50 MG tablet Take 50 mg by mouth daily.   Yes [provider]  CALCIUM PO Take 1 tablet by mouth daily.    Yes [provider]  Cyanocobalamin (VITAMIN B-12 PO) Take 1 tablet by mouth daily.    Yes [provider]  ferrous sulfate 325 (65 FE) MG tablet Take 1 tablet (325 mg total) by mouth daily with breakfast. Take 1 tablet 1 to 2 times daily, with vitamin C 08/03/20  Yes [name], NP  Pyridoxine HCl (VITAMIN B-6 PO) Take 1 tablet by mouth daily.    Yes [provider]  simvastatin (ZOCOR) 40 MG tablet Take 40 mg by mouth every evening.    Yes [provider]  VITAMIN D PO Take 1 tablet by mouth daily.   Yes [provider]  CRANBERRY PO Take 1 tablet by mouth daily. Patient not taking: Reported on 06/28/2020    [provider]  EPINEPHrine (EPIPEN 2-PAK) 0.3 mg/0.3 mL IJ SOAJ injection Inject 0.3 mLs (0.3 mg total) into the muscle as needed for anaphylaxis. 08/12/19   Veryl Speak, MD  NIACIN PO Take 1 tablet by mouth daily. Patient not taking: Reported on 06/28/2020    [provider]  pantoprazole (PROTONIX) 40 MG tablet Take 1 tablet (40 mg total) by mouth daily. Patient not taking: Reported on 09/01/2020 06/08/20   Lacretia Leigh, MD  predniSONE (DELTASONE) 10 MG tablet Take 2 tablets (20 mg total) by mouth 2 (two) times daily with a meal. Patient not taking: Reported on 06/28/2020 08/12/19   Veryl Speak, MD  VITAMIN E PO Take 1 tablet by mouth daily. Patient not taking: Reported on 06/28/2020    [provider]  zolpidem (AMBIEN) 10 MG tablet Take 10 mg by mouth at bedtime as needed for sleep.  12/15/18   [provider]     Vital Signs: BP (!) 155/81   Pulse 71   Temp 98 F (36.7 C) (Oral)   Resp 16   Ht $R'5\' 7"'iZ$  (1.702 m)   Wt 165 lb (74.8 kg)   SpO2 98%   BMI 25.84 kg/m   Physical Exam awake, alert.  Chest clear  to auscultation bilaterally.  Heart with regular rate and rhythm.  Abdomen soft, positive bowel sounds, nontender.  No significant lower extremity edema.  Imaging: No results found.  Labs:  CBC: Recent Labs    06/28/20 1455 08/03/20 1126 08/11/20 0837 09/01/20 0730  WBC 12.1* 10.3 7.6 7.6  HGB 8.8* 9.8* 9.9* 10.2*  HCT 29.2* 32.2* 33.1* 33.5*  PLT 484* 417* 416* 357    COAGS: Recent Labs    09/01/20 0730  INR 1.0    BMP: Recent Labs    06/08/20 2036  NA 137  K 4.0  CL 102  CO2 27  GLUCOSE 118*  BUN 9  CALCIUM 8.4*  CREATININE 0.98  GFRNONAA 58*  GFRAA >60    LIVER FUNCTION TESTS: Recent Labs    06/08/20 2036  BILITOT 0.3  AST 21  ALT 19  ALKPHOS 73  PROT 7.1  ALBUMIN 2.3*    Assessment and  Plan: Pt with history of recurrent anemia with M spike noted on SPEP as well as elevated kappa/ lambda light chains.  She presents today for CT-guided bone marrow biopsy for further evaluation. Risks and benefits of procedure was discussed with the patient  including, but not limited to bleeding, infection, damage to adjacent structures or low yield requiring additional tests.  All of the questions were answered and there is agreement to proceed.  Consent signed and in chart.     Electronically Signed: D. Rowe Robert, PA-C 09/01/2020, 8:17 AM   I spent a total of 20 minutes at the the patient's bedside AND on the patient's hospital floor or unit, greater than 50% of which was counseling/coordinating care for CT-guided bone marrow biopsy

## 2020-09-05 LAB — SURGICAL PATHOLOGY

## 2020-09-11 ENCOUNTER — Encounter: Payer: Self-pay | Admitting: Hematology

## 2020-09-11 ENCOUNTER — Inpatient Hospital Stay: Payer: Medicare Other | Attending: Nurse Practitioner | Admitting: Hematology

## 2020-09-11 ENCOUNTER — Encounter (HOSPITAL_COMMUNITY): Payer: Self-pay | Admitting: Hematology

## 2020-09-11 DIAGNOSIS — D472 Monoclonal gammopathy: Secondary | ICD-10-CM

## 2020-09-11 NOTE — Progress Notes (Signed)
[place]   Telephone:(336) [phone] [phone]) [phone]   Clinic Follow up Note   Patient Care Team: Christa See, FNP as PCP - General (Family Medicine) [name], NP as Nurse Practitioner (Nurse Practitioner) [name], MD as Consulting Physician (Hematology) [name], MD as Consulting Physician (Gastroenterology)   I connected with [name] on 09/11/2020 at 12:00 PM EST by telephone visit and verified that I am speaking with the correct person using two identifiers.  I discussed the limitations, risks, security and privacy concerns of performing an evaluation and management service by telephone and the availability of in person appointments. I also discussed with the patient that there may be a patient responsible charge related to this service. The patient expressed understanding and agreed to proceed.   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 [name] on 01/01/20 and IV Venofer since 07/05/20. Last dose 07/14/20.   INTERVAL HISTORY:  Rebecca Chen is here for a follow up. She notes she is doing well. She notes her bone marrow biopsy went well. She is agreeable to proceed with monitoring her labs.    REVIEW OF SYSTEMS:   Constitutional: Denies fevers, chills or abnormal weight loss Eyes: Denies blurriness of vision Ears, nose, mouth, throat, and face: Denies mucositis or sore throat Respiratory: Denies cough, dyspnea or wheezes Cardiovascular: Denies palpitation, chest discomfort or lower extremity swelling Gastrointestinal:  Denies nausea, heartburn or change in bowel habits Skin: Denies abnormal skin rashes Lymphatics: Denies new lymphadenopathy or easy bruising Neurological:Denies numbness, tingling or new weaknesses Behavioral/Psych: Mood is stable,  no new changes  All other systems were reviewed with the patient and are negative.  MEDICAL HISTORY:  Past Medical History:  Diagnosis Date  . AKI (acute kidney injury) ([place]) 01/01/2019  . Hypertension     SURGICAL HISTORY: Past Surgical History:  Procedure Laterality Date  . FOOT SURGERY    . IR IVC FILTER PLMT / S&I /IMG GUID/MOD SED  01/01/2019  . IR IVC FILTER RETRIEVAL / S&I /IMG GUID/MOD SED  08/09/2019  . IR RADIOLOGIST EVAL & MGMT  07/21/2019    I have reviewed the social history and family history with the patient and they are unchanged from previous note.  ALLERGIES:  is allergic to mixed vespid venom and bee venom.  MEDICATIONS:  Current Outpatient Medications  Medication Sig Dispense Refill  . Ascorbic Acid (VITAMIN C PO) Take 1 tablet by mouth daily.     [name] atenolol (TENORMIN) 50 MG tablet Take 50 mg by mouth daily.    [name] CALCIUM PO Take 1 tablet by mouth daily.     [name] CRANBERRY PO Take 1 tablet by mouth daily. (Patient not taking: Reported on 06/28/2020)    . Cyanocobalamin (VITAMIN B-12 PO) Take 1 tablet by mouth daily.     [name] EPINEPHrine (EPIPEN 2-PAK) 0.3 mg/0.3 mL IJ SOAJ injection Inject 0.3 mLs (0.3 mg total) into the muscle as needed for anaphylaxis. 1 each 0  . ferrous sulfate 325 (65 FE) MG tablet Take 1 tablet (325 mg total) by mouth daily with breakfast. Take 1 tablet 1 to 2 times daily, with vitamin C 60 tablet 1  . NIACIN PO Take 1 tablet by mouth daily. (Patient not taking: Reported on 06/28/2020)    .  pantoprazole (PROTONIX) 40 MG tablet Take 1 tablet (40 mg total) by mouth daily. (Patient not taking: Reported on 09/01/2020) 30 tablet 0  . predniSONE (DELTASONE) 10 MG tablet Take 2 tablets (20 mg total) by mouth 2 (two) times daily with a meal. (Patient not taking: Reported on 06/28/2020) 12 tablet 0  . Pyridoxine HCl (VITAMIN B-6 PO) Take 1 tablet by mouth daily.     . simvastatin (ZOCOR) 40 MG tablet Take 40 mg by mouth every evening.     [name] VITAMIN D PO Take 1  tablet by mouth daily.    [name] VITAMIN E PO Take 1 tablet by mouth daily. (Patient not taking: Reported on 06/28/2020)    . zolpidem (AMBIEN) 10 MG tablet Take 10 mg by mouth at bedtime as needed for sleep.      No current facility-administered medications for this visit.    PHYSICAL EXAMINATION: ECOG PERFORMANCE STATUS: 1 - Symptomatic but completely ambulatory  No vitals taken today, Exam not performed today   LABORATORY DATA:  I have reviewed the data as listed CBC Latest Ref Rng & Units 09/01/2020 08/11/2020 08/03/2020  WBC 4.0 - 10.5 K/uL 7.6 7.6 10.3  Hemoglobin 12.0 - 15.0 g/dL 10.2(L) 9.9(L) 9.8(L)  Hematocrit 36.0 - 46.0 % 33.5(L) 33.1(L) 32.2(L)  Platelets 150 - 400 K/uL 357 416(H) 417(H)     CMP Latest Ref Rng & Units 06/08/2020 08/12/2019 08/09/2019  Glucose 70 - 99 mg/dL 118(H) 155(H) 100(H)  BUN 8 - 23 mg/dL 9 29(H) 26(H)  Creatinine 0.44 - 1.00 mg/dL 0.98 1.20(H) 1.01(H)  Sodium 135 - 145 mmol/L 137 138 141  Potassium 3.5 - 5.1 mmol/L 4.0 4.1 4.1  Chloride 98 - 111 mmol/L 102 105 111  CO2 22 - 32 mmol/L 27 23 21(L)  Calcium 8.9 - 10.3 mg/dL 8.4(L) 8.8(L) 9.1  Total Protein 6.5 - 8.1 g/dL 7.1 - -  Total Bilirubin 0.3 - 1.2 mg/dL 0.3 - -  Alkaline Phos 38 - 126 U/L 73 - -  AST 15 - 41 U/L 21 - -  ALT 0 - 44 U/L 19 - -     DIAGNOSIS: 09/01/20 BONE MARROW, ASPIRATE, CLOT, CORE:  -Normocellular bone marrow for age with trilineage hematopoiesis  -Slight plasmacytosis (plasma cells 5%)  -See comment   PERIPHERAL BLOOD:  -Normocytic-normochromic anemia   COMMENT:   The bone marrow is normocellular for age with trilineage hematopoiesis  and nonspecific changes. Significant dyspoiesis or increase in blastic  cells is not identified. In this background, the plasma cells are  slightly increased in number representing 5% of all cells but with lack  of large aggregates of sheets. Immunohistochemical stains highlight a  relatively minor plasma cell component which  generally shows weak  staining for kappa and lambda light chains but with slight kappa light  chains excess. The findings are limited and not considered specific or  diagnostic of plasma cell neoplasm. Clinical and cytogenetic  correlation is recommended.   Cytogenetics was normal female karyotype.    RADIOGRAPHIC STUDIES: I have personally reviewed the radiological images as listed and agreed with the findings in the report. No results found.   ASSESSMENT & PLAN:  Rebecca Chen is a 74 y.o. female with    1. Anemia of Chronic disease and history of iron deficiency -She previously had iron deficient anemia, and resolved after iron supplement in 2020 -She had a recurrent anemia in August 2021 after upper respiratory infection and antibiotics.  -She was treated with  4 doses of IV Venofer in 06/2020 and responded well but anemia did not resolve  -07/2020 Labs showed low TIBC which supports anemia of chronic disease. Other lab work was negative for other nutritional anemia. No lab evidence of hemolysis -Labs reviewed last week and shows improved Hg at 10.2. She is still very fatigued.  -Given mild anemia, more treatment such as EPO injection is not indicated at this time. Will continue IV Venofer as needed. Will monitor with labs.    2. MGUS -During lab work up for her anemia, 08/11/20 SPEP showed positive M protein at the low level, 0.6g/dl, elevated kappa and lambda light chain level, which is nonspecific.  -She underwent Bone marrow biopsy on 09/01/20. Pathology was overall negative, but did show 5% plasmacytosis I reviewed with patient in great detail today. 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 I discussed this can evolve into Multiple Myeloma. I recommend bone survey to evaluate for lytic bone lesions. She is agreeable.  -I discussed MGUS  does not require treatment. Will monitor.     PLAN:  -bone marrow biopsy results discussed  -F/u in 4 weeks -bone survey and lab in 3 weeks.    No problem-specific Assessment & Plan notes found for this encounter.   Orders Placed This Encounter  Procedures  . DG Bone Survey Met    Standing Status:   Future    Standing Expiration Date:   09/11/2021    Order Specific Question:   Reason for Exam (SYMPTOM  OR DIAGNOSIS REQUIRED)    Answer:   rule out lytic bone lesion    Order Specific Question:   Preferred imaging location?    Answer:   [place] with reflex to [facility]    Standing Status:   Standing    Number of Occurrences:   3    Standing Expiration Date:   09/11/2021  . Kappa/lambda light chains    Standing Status:   Standing    Number of Occurrences:   3    Standing Expiration Date:   09/11/2021   I discussed the assessment and treatment plan with the patient. The patient was provided an opportunity to ask questions and all were answered. The patient agreed with the plan and demonstrated an understanding of the instructions.  The patient was advised to call back or seek an in-person evaluation if the symptoms worsen or if the condition fails to improve as anticipated.  The total time spent in the appointment was 25 minutes.    [name], MD 09/11/2020   I, Joslyn Devon, am acting as scribe for [name], MD.   I have reviewed the above documentation for accuracy and completeness, and I agree with the above.

## 2020-09-13 ENCOUNTER — Telehealth: Payer: Self-pay | Admitting: Hematology

## 2020-09-13 NOTE — Telephone Encounter (Signed)
Scheduled appts per 12/13 los. Pt confirmed appt date and time.  

## 2020-10-10 ENCOUNTER — Other Ambulatory Visit: Payer: Self-pay

## 2020-10-10 ENCOUNTER — Ambulatory Visit (HOSPITAL_COMMUNITY)
Admission: RE | Admit: 2020-10-10 | Discharge: 2020-10-10 | Disposition: A | Discharge status: Home / Self Care | Payer: Medicare Other | Source: Ambulatory Visit | Attending: Hematology | Admitting: Hematology

## 2020-10-10 ENCOUNTER — Inpatient Hospital Stay: Payer: Medicare Other | Attending: Nurse Practitioner

## 2020-10-10 DIAGNOSIS — M5032 Other cervical disc degeneration, mid-cervical region, unspecified level: Secondary | ICD-10-CM | POA: Insufficient documentation

## 2020-10-10 DIAGNOSIS — I700 Atherosclerosis of aorta: Secondary | ICD-10-CM | POA: Diagnosis not present

## 2020-10-10 DIAGNOSIS — D472 Monoclonal gammopathy: Secondary | ICD-10-CM | POA: Diagnosis not present

## 2020-10-10 DIAGNOSIS — M5136 Other intervertebral disc degeneration, lumbar region: Secondary | ICD-10-CM | POA: Diagnosis not present

## 2020-10-10 DIAGNOSIS — Z7952 Long term (current) use of systemic steroids: Secondary | ICD-10-CM | POA: Insufficient documentation

## 2020-10-10 DIAGNOSIS — Z79899 Other long term (current) drug therapy: Secondary | ICD-10-CM | POA: Insufficient documentation

## 2020-10-10 DIAGNOSIS — D638 Anemia in other chronic diseases classified elsewhere: Secondary | ICD-10-CM | POA: Insufficient documentation

## 2020-10-10 DIAGNOSIS — D509 Iron deficiency anemia, unspecified: Secondary | ICD-10-CM | POA: Insufficient documentation

## 2020-10-10 DIAGNOSIS — I10 Essential (primary) hypertension: Secondary | ICD-10-CM | POA: Insufficient documentation

## 2020-10-10 LAB — CBC WITH DIFFERENTIAL (CANCER CENTER ONLY)
Abs Immature Granulocytes: 0.03 10*3/uL (ref 0.00–0.07)
Basophils Absolute: 0.1 10*3/uL (ref 0.0–0.1)
Basophils Relative: 1 %
Eosinophils Absolute: 0.2 10*3/uL (ref 0.0–0.5)
Eosinophils Relative: 4 %
HCT: 38.2 % (ref 36.0–46.0)
Hemoglobin: 11.9 g/dL — ABNORMAL LOW (ref 12.0–15.0)
Immature Granulocytes: 1 %
Lymphocytes Relative: 19 %
Lymphs Abs: 1.1 10*3/uL (ref 0.7–4.0)
MCH: 28.4 pg (ref 26.0–34.0)
MCHC: 31.2 g/dL (ref 30.0–36.0)
MCV: 91.2 fL (ref 80.0–100.0)
Monocytes Absolute: 0.6 10*3/uL (ref 0.1–1.0)
Monocytes Relative: 10 %
Neutro Abs: 4 10*3/uL (ref 1.7–7.7)
Neutrophils Relative %: 65 %
Platelet Count: 305 10*3/uL (ref 150–400)
RBC: 4.19 MIL/uL (ref 3.87–5.11)
RDW: 17.1 % — ABNORMAL HIGH (ref 11.5–15.5)
WBC Count: 6 10*3/uL (ref 4.0–10.5)
nRBC: 0 % (ref 0.0–0.2)

## 2020-10-10 LAB — FERRITIN: Ferritin: 205 ng/mL (ref 11–307)

## 2020-10-10 LAB — IRON AND TIBC
Iron: 87 ug/dL (ref 41–142)
Saturation Ratios: 33 % (ref 21–57)
TIBC: 264 ug/dL (ref 236–444)
UIBC: 177 ug/dL (ref 120–384)

## 2020-10-11 LAB — KAPPA/LAMBDA LIGHT CHAINS
Kappa free light chain: 113.3 mg/L — ABNORMAL HIGH (ref 3.3–19.4)
Kappa, lambda light chain ratio: 2.72 — ABNORMAL HIGH (ref 0.26–1.65)
Lambda free light chains: 41.7 mg/L — ABNORMAL HIGH (ref 5.7–26.3)

## 2020-10-11 NOTE — Progress Notes (Signed)
[place]   Telephone:(336) [phone] Fax:(336) [phone]   Clinic Follow up Note   Patient Care Team: Christa See, FNP as PCP - General (Family Medicine) [name], NP as Nurse Practitioner (Nurse Practitioner) [name], MD as Consulting Physician (Hematology) [name], MD as Consulting Physician (Gastroenterology)   I connected with Rebecca Chen on 10/16/2020 at  1:20 PM EST by telephone visit and verified that I am speaking with the correct person using two identifiers.  I discussed the limitations, risks, security and privacy concerns of performing an evaluation and management service by telephone and the availability of in person appointments. I also discussed with the patient that there may be a patient responsible charge related to this service. The patient expressed understanding and agreed to proceed.   Other persons participating in the visit and their role in the encounter:  None   Patient's location:  Home  Provider's location:  Office   CHIEF COMPLAINT:  F/u of anemia and MGUS  SUMMARY OF ONCOLOGIC HISTORY: Oncology History   No history exists.     CURRENT THERAPY:  IV iron as needed. Rebecca Chen on 01/01/20 and IV Venofer since 07/05/20. Last dose 07/14/20.   INTERVAL HISTORY:  Rebecca Chen is scheduled for a phone visit to discuss her lab results.  He received a total of 4 doses of IV iron in October 2021, and her energy level much improved, close to normal now.  She denies any chest pain, or other new symptoms   All other systems were reviewed with the patient and are negative.  MEDICAL HISTORY:  Past Medical History:  Diagnosis Date  . AKI (acute kidney injury) ([place]) 01/01/2019  . Hypertension     SURGICAL HISTORY: Past Surgical History:  Procedure Laterality Date  . FOOT SURGERY    . IR IVC FILTER PLMT / S&I /IMG GUID/MOD SED  01/01/2019  . IR IVC FILTER RETRIEVAL / S&I /IMG GUID/MOD SED  08/09/2019  . IR RADIOLOGIST EVAL &  MGMT  07/21/2019    I have reviewed the social history and family history with the patient and they are unchanged from previous note.  ALLERGIES:  is allergic to mixed vespid venom and bee venom.  MEDICATIONS:  Current Outpatient Medications  Medication Sig Dispense Refill  . Ascorbic Acid (VITAMIN C PO) Take 1 tablet by mouth daily.     [name] atenolol (TENORMIN) 50 MG tablet Take 50 mg by mouth daily.    [name] CALCIUM PO Take 1 tablet by mouth daily.     [name] CRANBERRY PO Take 1 tablet by mouth daily. (Patient not taking: Reported on 06/28/2020)    . Cyanocobalamin (VITAMIN B-12 PO) Take 1 tablet by mouth daily.     [name] EPINEPHrine (EPIPEN 2-PAK) 0.3 mg/0.3 mL IJ SOAJ injection Inject 0.3 mLs (0.3 mg total) into the muscle as needed for anaphylaxis. 1 each 0  . ferrous sulfate 325 (65 FE) MG tablet Take 1 tablet (325 mg total) by mouth daily with breakfast. Take 1 tablet 1 to 2 times daily, with vitamin C 60 tablet 1  . NIACIN PO Take 1 tablet by mouth daily. (Patient not taking: Reported on 06/28/2020)    . pantoprazole (PROTONIX) 40 MG tablet Take 1 tablet (40 mg total) by mouth daily. (Patient not taking: Reported on 09/01/2020) 30 tablet 0  . predniSONE (DELTASONE) 10 MG tablet Take 2 tablets (20 mg total) by mouth 2 (two) times daily with a meal. (Patient not taking:  Reported on 06/28/2020) 12 tablet 0  . Pyridoxine HCl (VITAMIN B-6 PO) Take 1 tablet by mouth daily.     . simvastatin (ZOCOR) 40 MG tablet Take 40 mg by mouth every evening.     [name] VITAMIN D PO Take 1 tablet by mouth daily.    [name] VITAMIN E PO Take 1 tablet by mouth daily. (Patient not taking: Reported on 06/28/2020)    . zolpidem (AMBIEN) 10 MG tablet Take 10 mg by mouth at bedtime as needed for sleep.      No current facility-administered medications for this visit.    PHYSICAL EXAMINATION: ECOG PERFORMANCE STATUS: 0 - Asymptomatic  No vitals taken today, Exam not performed today   LABORATORY DATA:  I have reviewed the data as  listed CBC Latest Ref Rng & Units 10/10/2020 09/01/2020 08/11/2020  WBC 4.0 - 10.5 K/uL 6.0 7.6 7.6  Hemoglobin 12.0 - 15.0 g/dL 11.9(L) 10.2(L) 9.9(L)  Hematocrit 36.0 - 46.0 % 38.2 33.5(L) 33.1(L)  Platelets 150 - 400 K/uL 305 357 416(H)     CMP Latest Ref Rng & Units 06/08/2020 08/12/2019 08/09/2019  Glucose 70 - 99 mg/dL 118(H) 155(H) 100(H)  BUN 8 - 23 mg/dL 9 29(H) 26(H)  Creatinine 0.44 - 1.00 mg/dL 0.98 1.20(H) 1.01(H)  Sodium 135 - 145 mmol/L 137 138 141  Potassium 3.5 - 5.1 mmol/L 4.0 4.1 4.1  Chloride 98 - 111 mmol/L 102 105 111  CO2 22 - 32 mmol/L 27 23 21(L)  Calcium 8.9 - 10.3 mg/dL 8.4(L) 8.8(L) 9.1  Total Protein 6.5 - 8.1 g/dL 7.1 - -  Total Bilirubin 0.3 - 1.2 mg/dL 0.3 - -  Alkaline Phos 38 - 126 U/L 73 - -  AST 15 - 41 U/L 21 - -  ALT 0 - 44 U/L 19 - -     Bone Survey 10/10/20 IMPRESSION: 1. No evidence of aggressive appearing osseous lesion in the visualized axial or appendicular skeleton. 2. 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 3.  Aortic Atherosclerosis (ICD10-I70.0).   RADIOGRAPHIC STUDIES: I have personally reviewed the radiological images as listed and agreed with the findings in the report. No results found.   ASSESSMENT & PLAN:  Rebecca Chen is a 75 y.o. female with   1. Anemia of Chronic disease and history of iron deficiency -She previously had iron deficient anemia, and resolved after iron supplement in 2020 -She had a recurrent anemia in August 2021 after upper respiratory infection and antibiotics.  -She was treated with 4 doses of IV Venofer in 06/2020 and responded well but anemia did not resolve  -07/2020 Labs showed low TIBC which supports anemia of chronic disease. Other lab work was negative for other nutritional anemia. No lab evidence of hemolysis -Labs reviewed last week and shows improved Hg at 10.2. She is still very fatigued.  -Given mild anemia, more treatment such  as EPO injection is not indicated at this time. Will continue IV Venofer as needed. Will monitor with labs.  -She responded to IV iron very well, anemia near resolved now -Monitor labs every 2 months   2.MGUS -During lab work up for her anemia, 08/11/20 SPEP showed positive M protein at the low level,0.6g/dl,elevated kappa and lambda light chain level, which is nonspecific.  -She underwent Bone marrow biopsy on 09/01/20. Pathology was overall negative, but did show 5% plasmacytosis I reviewed with patient in great detail today. 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 I discussed this can evolve into Multiple Myeloma.  -Her bone survey from 10/10/20 was negative  -I discussed MGUS does not require treatment. Will monitor.  -Repeat labs every 4 months    PLAN:  -Recent lab results reviewed with patient, anemia resolved -Labs every 2 months for CBC and iron study, every 4 months for MGUS -Follow-up in 6 months   No problem-specific Assessment & Plan notes found for this encounter.   No orders of the defined types were placed in this encounter.  I discussed the assessment and treatment plan with the patient. The patient was provided an opportunity to ask questions and all were answered. The patient agreed with the plan and demonstrated an understanding of the instructions.  The patient was advised to call back or seek an in-person evaluation if the symptoms worsen or if the condition fails to improve as anticipated.  The total time spent in the appointment was 15 minutes.    [name], MD 10/16/2020   I, Joslyn Devon, am acting as scribe for [name], MD.   I have reviewed the above documentation for accuracy and completeness, and I agree with the above.

## 2020-10-13 LAB — IMMUNOFIXATION REFLEX, SERUM
IgA: 300 mg/dL (ref 64–422)
IgG (Immunoglobin G), Serum: 2218 mg/dL — ABNORMAL HIGH (ref 586–1602)
IgM (Immunoglobulin M), Srm: 106 mg/dL (ref 26–217)

## 2020-10-13 LAB — PROTEIN ELECTROPHORESIS, SERUM, WITH REFLEX
A/G Ratio: 0.8 (ref 0.7–1.7)
Albumin ELP: 3.4 g/dL (ref 2.9–4.4)
Alpha-1-Globulin: 0.2 g/dL (ref 0.0–0.4)
Alpha-2-Globulin: 0.9 g/dL (ref 0.4–1.0)
Beta Globulin: 2.5 g/dL — ABNORMAL HIGH (ref 0.7–1.3)
Gamma Globulin: 0.7 g/dL (ref 0.4–1.8)
Globulin, Total: 4.2 g/dL — ABNORMAL HIGH (ref 2.2–3.9)
M-Spike, %: 0.7 g/dL — ABNORMAL HIGH
SPEP Interpretation: 0
Total Protein ELP: 7.6 g/dL (ref 6.0–8.5)

## 2020-10-16 ENCOUNTER — Inpatient Hospital Stay (HOSPITAL_BASED_OUTPATIENT_CLINIC_OR_DEPARTMENT_OTHER): Payer: Medicare Other | Admitting: Hematology

## 2020-10-16 ENCOUNTER — Encounter: Payer: Self-pay | Admitting: Hematology

## 2020-10-16 DIAGNOSIS — D638 Anemia in other chronic diseases classified elsewhere: Secondary | ICD-10-CM | POA: Diagnosis not present

## 2020-10-17 ENCOUNTER — Telehealth: Payer: Self-pay | Admitting: Hematology

## 2020-10-17 NOTE — Telephone Encounter (Signed)
Scheduled appts per 1/17 los. Pt confirmed appt dates and times.  °

## 2020-11-10 IMAGING — MG DIGITAL DIAGNOSTIC BILAT W/ TOMO W/ CAD
8 series · 8 of 24 positions shown · non-contrast
Comparison: Previous exam(s).

CLINICAL DATA: 74-year-old female presenting as a recall from
screening for possible right breast asymmetry and possible left
breast mass.

EXAM:
DIGITAL DIAGNOSTIC RIGHT MAMMOGRAM WITH TOMO
ULTRASOUND LEFT BREAST

[L ML synth-2D]
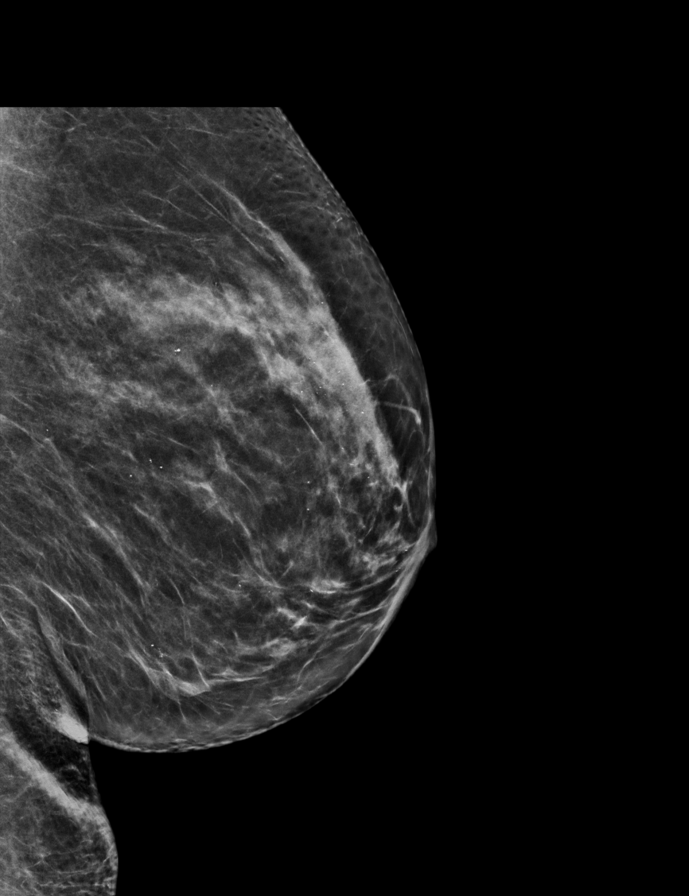

[R MLO synth-2D]
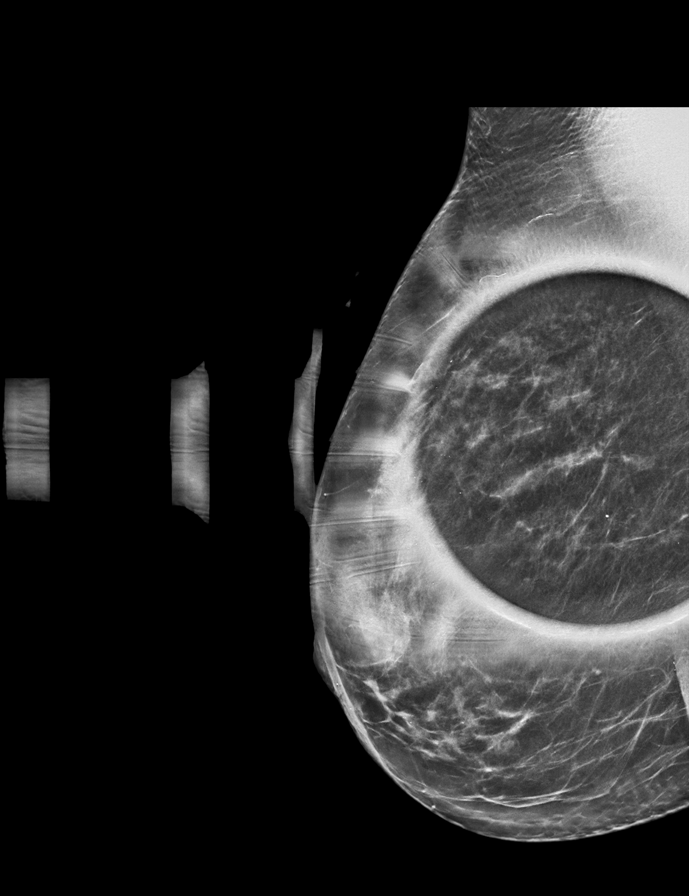

[L CC synth-2D]
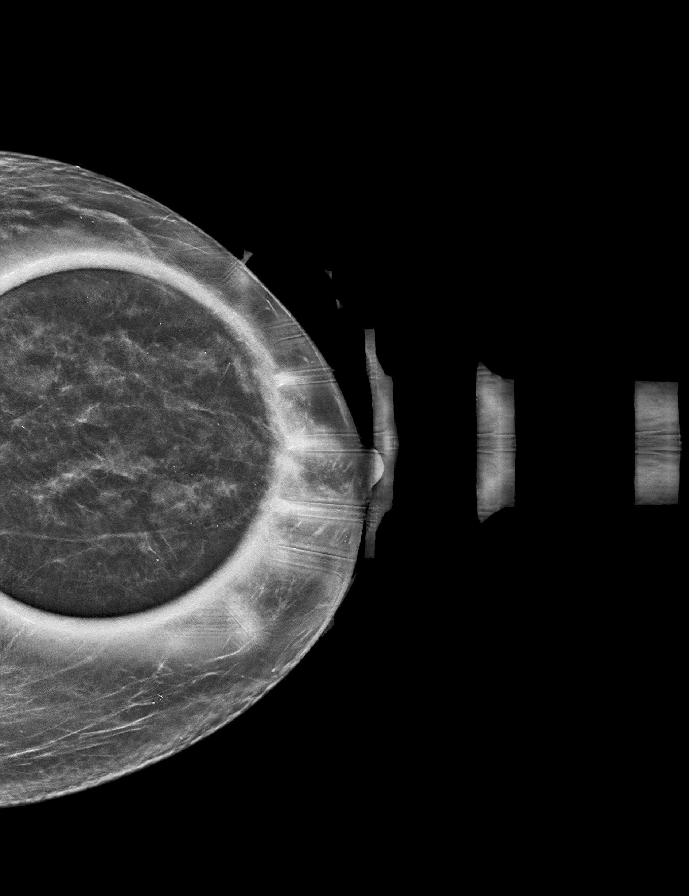

[R ML synth-2D]
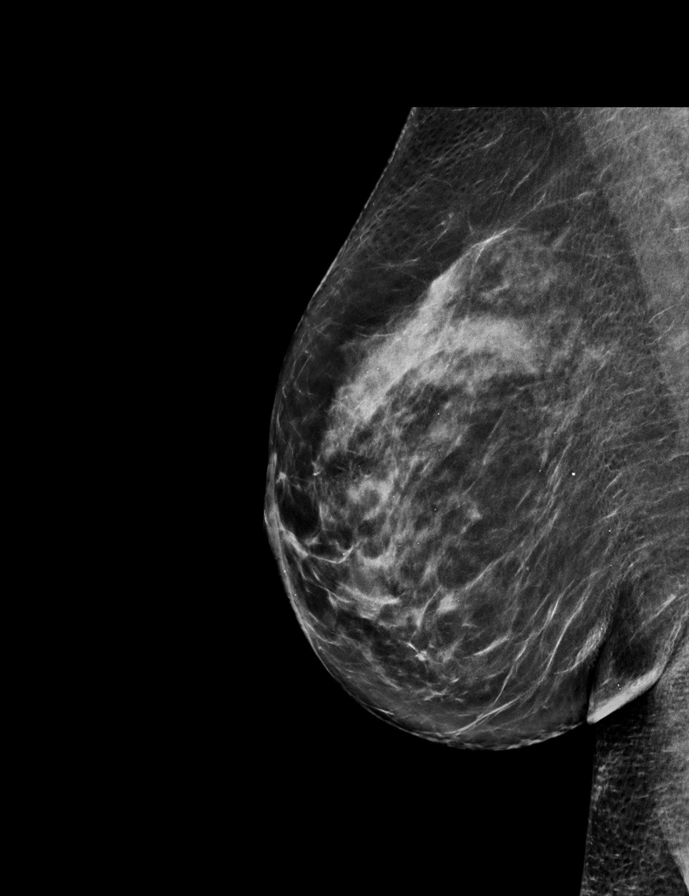

[R ML tomo · tomo slice 35/70.0]
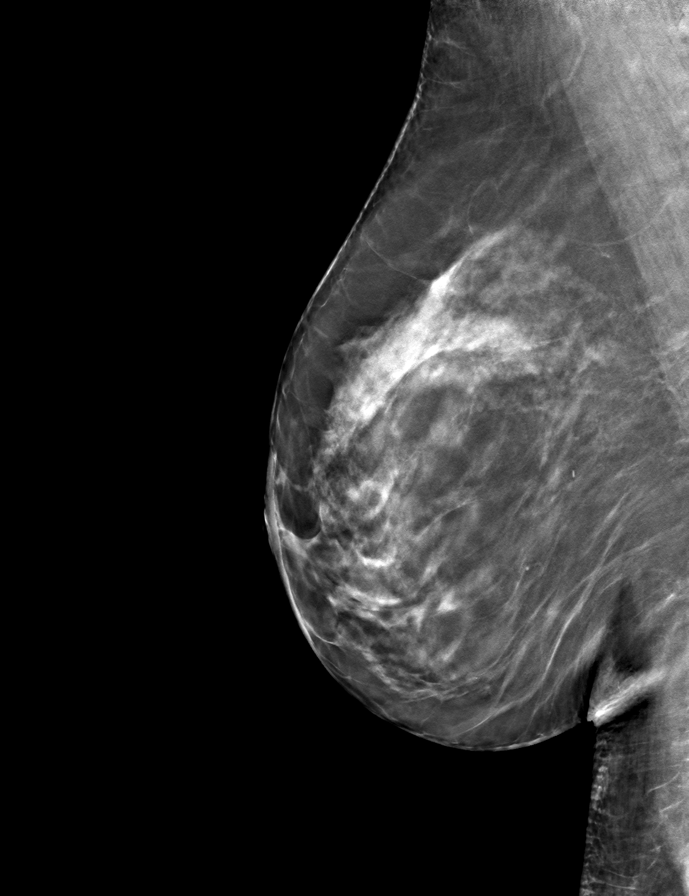

[L ML tomo · tomo slice 30/59.0]
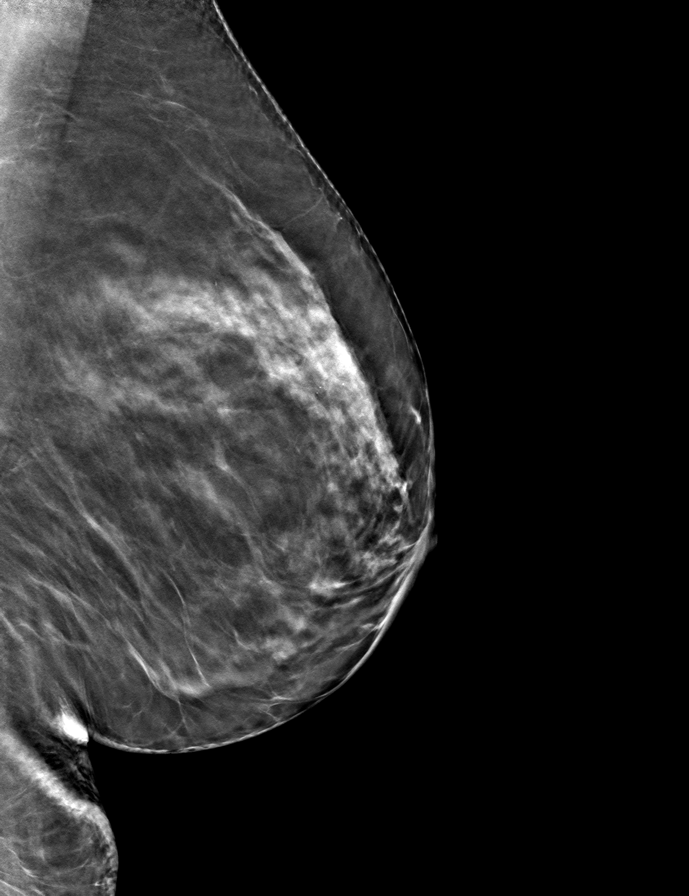

[R MLO tomo · tomo slice 31/60.0]
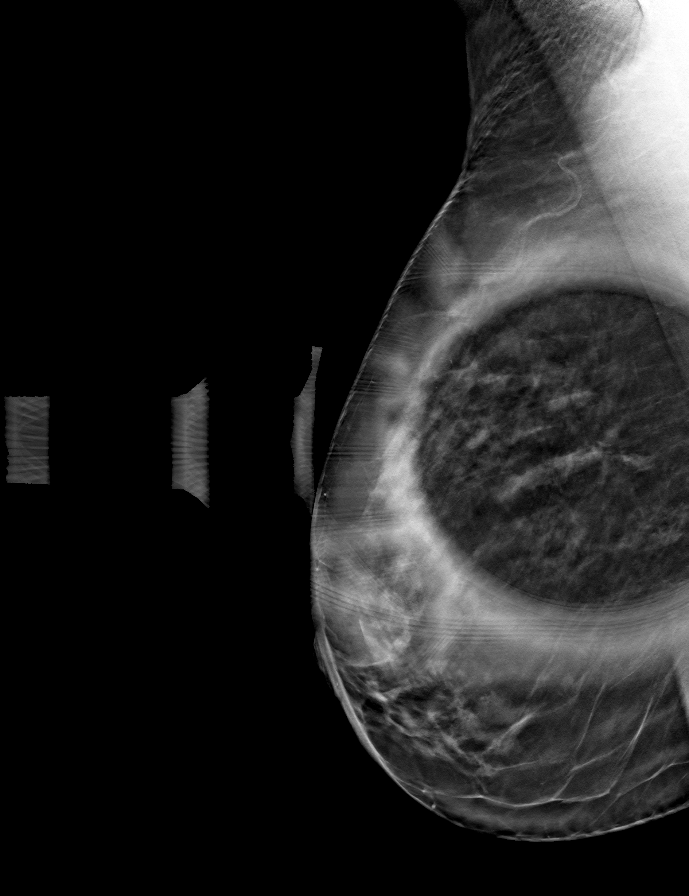

[L CC tomo · tomo slice 29/56.0]
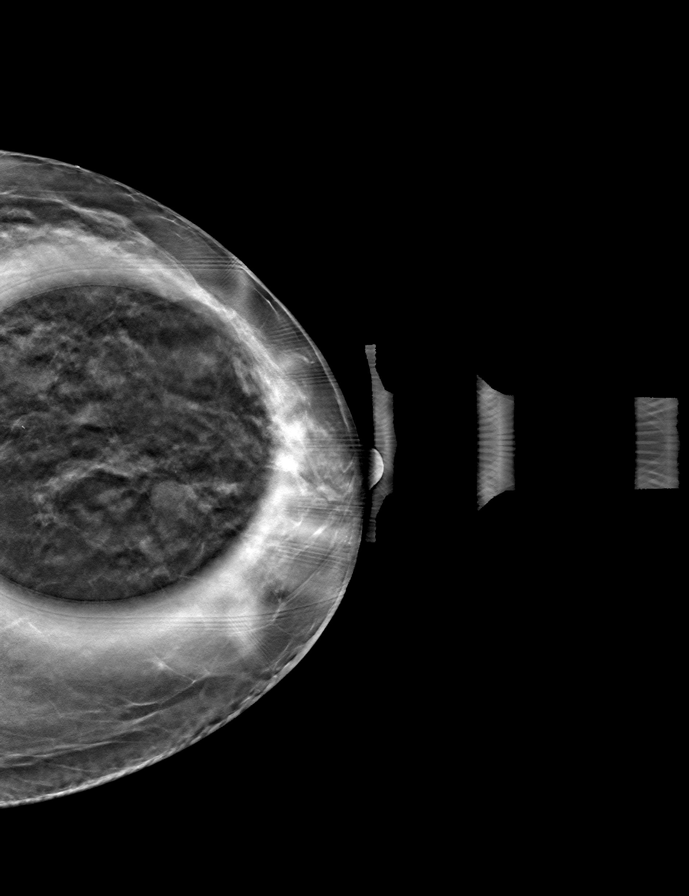

[8 of 24 positions shown; findings below may reference images not displayed]

ACR Breast Density Category c: The breast tissue is heterogeneously
dense, which may obscure small masses.
FINDINGS: Mammogram:

Right breast: Spot compression tomosynthesis and full field mL
tomosynthesis views of the right breast performed for the questioned
asymmetry seen on screening mammogram in the superior right breast.
On the additional imaging today the asymmetry does not persist, most
consistent with normal overlapping fibroglandular tissue.

Left breast: Spot compression tomosynthesis and full field mL
tomosynthesis views of the left breast performed for the questioned
mass seen only on the cc view on screening mammogram. There is
persistence of an oval mass in the upper slightly inner left breast
measuring approximately 0.8 cm. No additional new findings
identified.

Ultrasound:

Right breast:

Targeted ultrasound is performed throughout the superior aspect of
the right breast demonstrating no cystic or solid mass.

Left breast:

Targeted ultrasound is performed at 11 o'clock 2 cm from the nipple
demonstrating an oval circumscribed anechoic mass measuring 0.6 x
0.4 x 0.6 cm, consistent with a benign simple cyst. This corresponds
to the mass seen mammographically.
IMPRESSION: 1. Resolution of the questioned asymmetry seen on screening
mammogram in the right breast. No mammographic evidence of
malignancy.

2. Left breast mass at 11 o'clock consistent with a benign simple
cyst.

RECOMMENDATION:
Screening mammogram in one year.(Code:[id number])

I have discussed the findings and recommendations with the patient.
If applicable, a reminder letter will be sent to the patient
regarding the next appointment.

BI-RADS CATEGORY  2: Benign.

## 2020-12-08 ENCOUNTER — Other Ambulatory Visit: Payer: Self-pay | Admitting: Nurse Practitioner

## 2020-12-18 ENCOUNTER — Other Ambulatory Visit: Payer: Self-pay

## 2020-12-18 ENCOUNTER — Inpatient Hospital Stay: Payer: Medicare Other | Attending: Nurse Practitioner

## 2020-12-18 DIAGNOSIS — D472 Monoclonal gammopathy: Secondary | ICD-10-CM | POA: Insufficient documentation

## 2020-12-18 DIAGNOSIS — D509 Iron deficiency anemia, unspecified: Secondary | ICD-10-CM | POA: Diagnosis not present

## 2020-12-18 LAB — CBC WITH DIFFERENTIAL (CANCER CENTER ONLY)
Abs Immature Granulocytes: 0.01 10*3/uL (ref 0.00–0.07)
Basophils Absolute: 0.1 10*3/uL (ref 0.0–0.1)
Basophils Relative: 1 %
Eosinophils Absolute: 0.2 10*3/uL (ref 0.0–0.5)
Eosinophils Relative: 4 %
HCT: 35.3 % — ABNORMAL LOW (ref 36.0–46.0)
Hemoglobin: 11.2 g/dL — ABNORMAL LOW (ref 12.0–15.0)
Immature Granulocytes: 0 %
Lymphocytes Relative: 23 %
Lymphs Abs: 1.4 10*3/uL (ref 0.7–4.0)
MCH: 29.8 pg (ref 26.0–34.0)
MCHC: 31.7 g/dL (ref 30.0–36.0)
MCV: 93.9 fL (ref 80.0–100.0)
Monocytes Absolute: 0.6 10*3/uL (ref 0.1–1.0)
Monocytes Relative: 11 %
Neutro Abs: 3.6 10*3/uL (ref 1.7–7.7)
Neutrophils Relative %: 61 %
Platelet Count: 260 10*3/uL (ref 150–400)
RBC: 3.76 MIL/uL — ABNORMAL LOW (ref 3.87–5.11)
RDW: 14.4 % (ref 11.5–15.5)
WBC Count: 5.8 10*3/uL (ref 4.0–10.5)
nRBC: 0 % (ref 0.0–0.2)

## 2020-12-18 LAB — FERRITIN: Ferritin: 97 ng/mL (ref 11–307)

## 2020-12-18 LAB — IRON AND TIBC
Iron: 99 ug/dL (ref 41–142)
Saturation Ratios: 35 % (ref 21–57)
TIBC: 285 ug/dL (ref 236–444)
UIBC: 186 ug/dL (ref 120–384)

## 2021-01-02 DIAGNOSIS — Z23 Encounter for immunization: Secondary | ICD-10-CM | POA: Diagnosis not present

## 2021-01-15 ENCOUNTER — Other Ambulatory Visit (HOSPITAL_COMMUNITY): Payer: Self-pay

## 2021-01-15 DIAGNOSIS — L0889 Other specified local infections of the skin and subcutaneous tissue: Secondary | ICD-10-CM | POA: Diagnosis not present

## 2021-01-15 DIAGNOSIS — S50861A Insect bite (nonvenomous) of right forearm, initial encounter: Secondary | ICD-10-CM | POA: Diagnosis not present

## 2021-01-15 DIAGNOSIS — W57XXXA Bitten or stung by nonvenomous insect and other nonvenomous arthropods, initial encounter: Secondary | ICD-10-CM | POA: Diagnosis not present

## 2021-01-15 DIAGNOSIS — L570 Actinic keratosis: Secondary | ICD-10-CM | POA: Diagnosis not present

## 2021-01-15 MED ORDER — CEPHALEXIN 500 MG PO CAPS
500.0000 mg | ORAL_CAPSULE | Freq: Two times a day (BID) | ORAL | 0 refills | Status: DC
  Filled 2021-01-15: qty 20, 10d supply, fill #0

## 2021-01-15 MED ORDER — FLUOROURACIL 5 % EX CREA
1.0000 "application " | TOPICAL_CREAM | Freq: Every day | CUTANEOUS | 0 refills | Status: DC
  Filled 2021-01-15: qty 40, 30d supply, fill #0

## 2021-01-15 MED ORDER — TRIAMCINOLONE ACETONIDE 0.1 % EX OINT
1.0000 "application " | TOPICAL_OINTMENT | Freq: Two times a day (BID) | CUTANEOUS | 0 refills | Status: AC
  Filled 2021-01-15: qty 45, 14d supply, fill #0

## 2021-02-15 DIAGNOSIS — D229 Melanocytic nevi, unspecified: Secondary | ICD-10-CM | POA: Diagnosis not present

## 2021-02-19 ENCOUNTER — Other Ambulatory Visit: Payer: Self-pay

## 2021-02-19 ENCOUNTER — Inpatient Hospital Stay: Payer: Medicare Other | Attending: Nurse Practitioner

## 2021-02-19 DIAGNOSIS — D472 Monoclonal gammopathy: Secondary | ICD-10-CM

## 2021-02-19 DIAGNOSIS — D509 Iron deficiency anemia, unspecified: Secondary | ICD-10-CM | POA: Diagnosis not present

## 2021-02-19 LAB — CBC WITH DIFFERENTIAL (CANCER CENTER ONLY)
Abs Immature Granulocytes: 0.03 10*3/uL (ref 0.00–0.07)
Basophils Absolute: 0 10*3/uL (ref 0.0–0.1)
Basophils Relative: 1 %
Eosinophils Absolute: 0.2 10*3/uL (ref 0.0–0.5)
Eosinophils Relative: 2 %
HCT: 39.7 % (ref 36.0–46.0)
Hemoglobin: 12.8 g/dL (ref 12.0–15.0)
Immature Granulocytes: 0 %
Lymphocytes Relative: 18 %
Lymphs Abs: 1.3 10*3/uL (ref 0.7–4.0)
MCH: 30 pg (ref 26.0–34.0)
MCHC: 32.2 g/dL (ref 30.0–36.0)
MCV: 93.2 fL (ref 80.0–100.0)
Monocytes Absolute: 0.8 10*3/uL (ref 0.1–1.0)
Monocytes Relative: 10 %
Neutro Abs: 5.3 10*3/uL (ref 1.7–7.7)
Neutrophils Relative %: 69 %
Platelet Count: 262 10*3/uL (ref 150–400)
RBC: 4.26 MIL/uL (ref 3.87–5.11)
RDW: 12.8 % (ref 11.5–15.5)
WBC Count: 7.6 10*3/uL (ref 4.0–10.5)
nRBC: 0 % (ref 0.0–0.2)

## 2021-02-19 LAB — IRON AND TIBC
Iron: 51 ug/dL (ref 41–142)
Saturation Ratios: 20 % — ABNORMAL LOW (ref 21–57)
TIBC: 259 ug/dL (ref 236–444)
UIBC: 208 ug/dL (ref 120–384)

## 2021-02-19 LAB — FERRITIN: Ferritin: 141 ng/mL (ref 11–307)

## 2021-02-20 LAB — KAPPA/LAMBDA LIGHT CHAINS
Kappa free light chain: 73.2 mg/L — ABNORMAL HIGH (ref 3.3–19.4)
Kappa, lambda light chain ratio: 1.92 — ABNORMAL HIGH (ref 0.26–1.65)
Lambda free light chains: 38.2 mg/L — ABNORMAL HIGH (ref 5.7–26.3)

## 2021-02-22 LAB — PROTEIN ELECTROPHORESIS, SERUM, WITH REFLEX
A/G Ratio: 0.9 (ref 0.7–1.7)
Albumin ELP: 3.4 g/dL (ref 2.9–4.4)
Alpha-1-Globulin: 0.3 g/dL (ref 0.0–0.4)
Alpha-2-Globulin: 1 g/dL (ref 0.4–1.0)
Beta Globulin: 1.2 g/dL (ref 0.7–1.3)
Gamma Globulin: 1.2 g/dL (ref 0.4–1.8)
Globulin, Total: 3.7 g/dL (ref 2.2–3.9)
M-Spike, %: 0.2 g/dL — ABNORMAL HIGH
SPEP Interpretation: 0
Total Protein ELP: 7.1 g/dL (ref 6.0–8.5)

## 2021-02-22 LAB — IMMUNOFIXATION REFLEX, SERUM
IgA: 329 mg/dL (ref 64–422)
IgG (Immunoglobin G), Serum: 1477 mg/dL (ref 586–1602)
IgM (Immunoglobulin M), Srm: 108 mg/dL (ref 26–217)

## 2021-03-09 ENCOUNTER — Encounter: Payer: Self-pay | Admitting: Hematology

## 2021-04-22 NOTE — Progress Notes (Signed)
[place]   Telephone:([phone] Fax:(336) [phone]   Clinic Follow up Note   Patient Care Team: Christa See, FNP as PCP - General (Family Medicine) [name], NP as Nurse Practitioner (Nurse Practitioner) [name], MD as Consulting Physician (Hematology) [name], MD as Consulting Physician (Gastroenterology)  Date of Service:  04/23/2021  I connected with Rebecca Chen on 04/23/2021 at  8:40 AM EDT by telephone visit and verified that I am speaking with the correct person using two identifiers.  I discussed the limitations, risks, security and privacy concerns of performing an evaluation and management service by telephone and the availability of in person appointments. I also discussed with the patient that there may be a patient responsible charge related to this service. The patient expressed understanding and agreed to proceed.   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 [name] on 01/01/20 and IV Venofer since 07/05/20. Last dose 07/14/20.  INTERVAL HISTORY:  Rebecca Chen is here for a follow up of anemia and MGUS. She was last seen by me on 10/16/20. There was a miscommunication, so she came in for lab work and then left thinking we would call her for her visit.   All other systems were reviewed with the patient and are negative.  MEDICAL HISTORY:  Past Medical History:  Diagnosis Date   AKI (acute kidney injury) ([place]) 01/01/2019   Hypertension     SURGICAL HISTORY: Past Surgical History:  Procedure Laterality Date   FOOT SURGERY     IR IVC FILTER PLMT / S&I /IMG GUID/MOD SED  01/01/2019   IR IVC FILTER RETRIEVAL / S&I /IMG GUID/MOD SED  08/09/2019   IR RADIOLOGIST EVAL & MGMT  07/21/2019    I have reviewed the social history and family  history with the patient and they are unchanged from previous note.  ALLERGIES:  is allergic to mixed vespid venom and bee venom.  MEDICATIONS:  Current Outpatient Medications  Medication Sig Dispense Refill   Ascorbic Acid (VITAMIN C PO) Take 1 tablet by mouth daily.      atenolol (TENORMIN) 50 MG tablet Take 50 mg by mouth daily.     CALCIUM PO Take 1 tablet by mouth daily.      cephALEXin (KEFLEX) 500 MG capsule Take 1 capsule (500 mg total) by mouth 2 (two) times daily. 20 capsule 0   COVID-19 mRNA vaccine, Pfizer, 30 MCG/0.3ML injection USE AS DIRECTED .3 mL 0   CRANBERRY PO Take 1 tablet by mouth daily. (Patient not taking: Reported on 06/28/2020)     Cyanocobalamin (VITAMIN B-12 PO) Take 1 tablet by mouth daily.      EPINEPHrine (EPIPEN 2-PAK) 0.3 mg/0.3 mL IJ SOAJ injection Inject 0.3 mLs (0.3 mg total) into the muscle as needed for anaphylaxis. 1 each 0   FEROSUL 325 (65 Fe) MG tablet TAKE 1 TABLET BY MOUTH ONCE OR TWICE DAILY, WITH VITAMIN-C 60 tablet 1   fluorouracil (EFUDEX) 5 % cream Apply 1 application topically daily for 14 days 40 g 0   NIACIN PO Take 1 tablet by mouth daily. (Patient not taking: Reported on 06/28/2020)     pantoprazole (PROTONIX) 40 MG tablet Take 1 tablet (40 mg total) by mouth daily. (Patient not taking: Reported on 09/01/2020) 30  tablet 0   predniSONE (DELTASONE) 10 MG tablet Take 2 tablets (20 mg total) by mouth 2 (two) times daily with a meal. (Patient not taking: Reported on 06/28/2020) 12 tablet 0   Pyridoxine HCl (VITAMIN B-6 PO) Take 1 tablet by mouth daily.      simvastatin (ZOCOR) 40 MG tablet Take 40 mg by mouth every evening.      VITAMIN D PO Take 1 tablet by mouth daily.     VITAMIN E PO Take 1 tablet by mouth daily. (Patient not taking: Reported on 06/28/2020)     zolpidem (AMBIEN) 10 MG tablet Take 10 mg by mouth at bedtime as needed for sleep.      No current facility-administered medications for this visit.    PHYSICAL EXAMINATION: ECOG  PERFORMANCE STATUS: 0 - Asymptomatic  There were no vitals filed for this visit. There were no vitals filed for this visit.  No vitals taken today, Exam not performed today  LABORATORY DATA:  I have reviewed the data as listed CBC Latest Ref Rng & Units 04/23/2021 02/19/2021 12/18/2020  WBC 4.0 - 10.5 K/uL 7.2 7.6 5.8  Hemoglobin 12.0 - 15.0 g/dL 12.4 12.8 11.2(L)  Hematocrit 36.0 - 46.0 % 37.7 39.7 35.3(L)  Platelets 150 - 400 K/uL 250 262 260     CMP Latest Ref Rng & Units 06/08/2020 08/12/2019 08/09/2019  Glucose 70 - 99 mg/dL 118(H) 155(H) 100(H)  BUN 8 - 23 mg/dL 9 29(H) 26(H)  Creatinine 0.44 - 1.00 mg/dL 0.98 1.20(H) 1.01(H)  Sodium 135 - 145 mmol/L 137 138 141  Potassium 3.5 - 5.1 mmol/L 4.0 4.1 4.1  Chloride 98 - 111 mmol/L 102 105 111  CO2 22 - 32 mmol/L 27 23 21(L)  Calcium 8.9 - 10.3 mg/dL 8.4(L) 8.8(L) 9.1  Total Protein 6.5 - 8.1 g/dL 7.1 - -  Total Bilirubin 0.3 - 1.2 mg/dL 0.3 - -  Alkaline Phos 38 - 126 U/L 73 - -  AST 15 - 41 U/L 21 - -  ALT 0 - 44 U/L 19 - -      RADIOGRAPHIC STUDIES: I have personally reviewed the radiological images as listed and agreed with the findings in the report. No results found.   ASSESSMENT & PLAN:  Rebecca Chen is a 75 y.o. female with   1. Anemia of Chronic disease and history of iron deficiency -She previously had iron deficient anemia, and resolved after iron supplement in 2020 -She had a recurrent anemia in August 2021 after upper respiratory infection and antibiotics.  -She was treated with 4 doses of IV Venofer in 06/2020 and responded well but anemia did not resolve. -07/2020 Labs showed low TIBC which supports anemia of chronic disease. Other lab work was negative for other nutritional anemia. No lab evidence of hemolysis -Labs reviewed, Hgb within normal range now, since last lab on 02/19/21. She has not required IV iron since 06/2020. -Monitor labs every 4 months   2. MGUS -During lab work up for her anemia,  08/11/20 SPEP showed positive M protein at the low level, 0.6g/dl, elevated kappa and lambda light chain level, which is nonspecific.  -She underwent Bone marrow biopsy on 09/01/20. Pathology was overall negative, but did show 5% plasmacytosis. Cytogenetics was normal -Her M protein has dropped to 0.22 months ago, light chain level also spontaneously improved, will continue monitoring.   PLAN:  -labs and f/u in 4 months    No problem-specific Assessment & Plan notes found for this encounter.  No orders of the defined types were placed in this encounter.  All questions were answered. The patient knows to call the clinic with any problems, questions or concerns. No barriers to learning was detected. The total time spent in the appointment was 12 minutes.     [name], MD 04/23/2021   I, Wilburn Mylar, am acting as scribe for [name], MD.   I have reviewed the above documentation for accuracy and completeness, and I agree with the above.

## 2021-04-23 ENCOUNTER — Inpatient Hospital Stay: Payer: Medicare Other | Attending: Nurse Practitioner | Admitting: Hematology

## 2021-04-23 ENCOUNTER — Encounter: Payer: Self-pay | Admitting: Hematology

## 2021-04-23 ENCOUNTER — Other Ambulatory Visit: Payer: Self-pay

## 2021-04-23 ENCOUNTER — Inpatient Hospital Stay: Payer: Medicare Other

## 2021-04-23 DIAGNOSIS — D509 Iron deficiency anemia, unspecified: Secondary | ICD-10-CM | POA: Diagnosis not present

## 2021-04-23 DIAGNOSIS — D638 Anemia in other chronic diseases classified elsewhere: Secondary | ICD-10-CM | POA: Diagnosis not present

## 2021-04-23 DIAGNOSIS — D472 Monoclonal gammopathy: Secondary | ICD-10-CM | POA: Diagnosis not present

## 2021-04-23 DIAGNOSIS — Z79899 Other long term (current) drug therapy: Secondary | ICD-10-CM | POA: Insufficient documentation

## 2021-04-23 LAB — FERRITIN: Ferritin: 149 ng/mL (ref 11–307)

## 2021-04-23 LAB — CBC WITH DIFFERENTIAL (CANCER CENTER ONLY)
Abs Immature Granulocytes: 0.02 10*3/uL (ref 0.00–0.07)
Basophils Absolute: 0 10*3/uL (ref 0.0–0.1)
Basophils Relative: 0 %
Eosinophils Absolute: 0.2 10*3/uL (ref 0.0–0.5)
Eosinophils Relative: 2 %
HCT: 37.7 % (ref 36.0–46.0)
Hemoglobin: 12.4 g/dL (ref 12.0–15.0)
Immature Granulocytes: 0 %
Lymphocytes Relative: 16 %
Lymphs Abs: 1.1 10*3/uL (ref 0.7–4.0)
MCH: 30 pg (ref 26.0–34.0)
MCHC: 32.9 g/dL (ref 30.0–36.0)
MCV: 91.1 fL (ref 80.0–100.0)
Monocytes Absolute: 0.7 10*3/uL (ref 0.1–1.0)
Monocytes Relative: 9 %
Neutro Abs: 5.1 10*3/uL (ref 1.7–7.7)
Neutrophils Relative %: 73 %
Platelet Count: 250 10*3/uL (ref 150–400)
RBC: 4.14 MIL/uL (ref 3.87–5.11)
RDW: 13.6 % (ref 11.5–15.5)
WBC Count: 7.2 10*3/uL (ref 4.0–10.5)
nRBC: 0 % (ref 0.0–0.2)

## 2021-04-23 LAB — IRON AND TIBC
Iron: 98 ug/dL (ref 41–142)
Saturation Ratios: 38 % (ref 21–57)
TIBC: 260 ug/dL (ref 236–444)
UIBC: 162 ug/dL (ref 120–384)

## 2021-04-24 ENCOUNTER — Telehealth: Payer: Self-pay | Admitting: Hematology

## 2021-04-24 NOTE — Telephone Encounter (Signed)
Left message with follow-up appointment per 7/25 los. 

## 2021-05-30 DIAGNOSIS — L578 Other skin changes due to chronic exposure to nonionizing radiation: Secondary | ICD-10-CM | POA: Diagnosis not present

## 2021-05-30 DIAGNOSIS — D225 Melanocytic nevi of trunk: Secondary | ICD-10-CM | POA: Diagnosis not present

## 2021-05-30 DIAGNOSIS — W57XXXA Bitten or stung by nonvenomous insect and other nonvenomous arthropods, initial encounter: Secondary | ICD-10-CM | POA: Diagnosis not present

## 2021-05-30 DIAGNOSIS — L814 Other melanin hyperpigmentation: Secondary | ICD-10-CM | POA: Diagnosis not present

## 2021-05-30 DIAGNOSIS — L821 Other seborrheic keratosis: Secondary | ICD-10-CM | POA: Diagnosis not present

## 2021-05-30 DIAGNOSIS — S80869A Insect bite (nonvenomous), unspecified lower leg, initial encounter: Secondary | ICD-10-CM | POA: Diagnosis not present

## 2021-05-30 DIAGNOSIS — Z85828 Personal history of other malignant neoplasm of skin: Secondary | ICD-10-CM | POA: Diagnosis not present

## 2021-06-08 DIAGNOSIS — Z23 Encounter for immunization: Secondary | ICD-10-CM | POA: Diagnosis not present

## 2021-07-09 ENCOUNTER — Other Ambulatory Visit: Payer: Self-pay | Admitting: Family Medicine

## 2021-07-09 DIAGNOSIS — Z1231 Encounter for screening mammogram for malignant neoplasm of breast: Secondary | ICD-10-CM

## 2021-07-11 ENCOUNTER — Telehealth: Payer: Self-pay | Admitting: Hematology

## 2021-07-11 NOTE — Telephone Encounter (Signed)
Rescheduled per MD sch change, message has been left with pt

## 2021-07-12 DIAGNOSIS — I10 Essential (primary) hypertension: Secondary | ICD-10-CM | POA: Diagnosis not present

## 2021-07-12 DIAGNOSIS — H35033 Hypertensive retinopathy, bilateral: Secondary | ICD-10-CM | POA: Diagnosis not present

## 2021-07-12 DIAGNOSIS — D509 Iron deficiency anemia, unspecified: Secondary | ICD-10-CM | POA: Diagnosis not present

## 2021-07-12 DIAGNOSIS — E785 Hyperlipidemia, unspecified: Secondary | ICD-10-CM | POA: Diagnosis not present

## 2021-07-12 DIAGNOSIS — E2839 Other primary ovarian failure: Secondary | ICD-10-CM | POA: Diagnosis not present

## 2021-07-12 DIAGNOSIS — Z23 Encounter for immunization: Secondary | ICD-10-CM | POA: Diagnosis not present

## 2021-07-12 DIAGNOSIS — E559 Vitamin D deficiency, unspecified: Secondary | ICD-10-CM | POA: Diagnosis not present

## 2021-07-12 DIAGNOSIS — Z0000 Encounter for general adult medical examination without abnormal findings: Secondary | ICD-10-CM | POA: Diagnosis not present

## 2021-07-12 DIAGNOSIS — G43109 Migraine with aura, not intractable, without status migrainosus: Secondary | ICD-10-CM | POA: Diagnosis not present

## 2021-07-12 DIAGNOSIS — I700 Atherosclerosis of aorta: Secondary | ICD-10-CM | POA: Diagnosis not present

## 2021-07-17 ENCOUNTER — Other Ambulatory Visit: Payer: Self-pay | Admitting: Family Medicine

## 2021-07-17 DIAGNOSIS — E2839 Other primary ovarian failure: Secondary | ICD-10-CM

## 2021-08-13 ENCOUNTER — Ambulatory Visit
Admission: RE | Admit: 2021-08-13 | Discharge: 2021-08-13 | Disposition: A | Discharge status: Home / Self Care | Payer: Medicare Other | Source: Ambulatory Visit | Attending: Family Medicine | Admitting: Family Medicine

## 2021-08-13 DIAGNOSIS — Z1231 Encounter for screening mammogram for malignant neoplasm of breast: Secondary | ICD-10-CM | POA: Diagnosis not present

## 2021-08-21 DIAGNOSIS — R059 Cough, unspecified: Secondary | ICD-10-CM | POA: Diagnosis not present

## 2021-08-24 ENCOUNTER — Ambulatory Visit: Payer: Medicare Other | Admitting: Hematology

## 2021-08-24 ENCOUNTER — Other Ambulatory Visit: Payer: Medicare Other

## 2021-08-28 ENCOUNTER — Other Ambulatory Visit: Payer: Self-pay | Admitting: *Deleted

## 2021-08-28 ENCOUNTER — Other Ambulatory Visit: Payer: Self-pay

## 2021-08-28 ENCOUNTER — Inpatient Hospital Stay (HOSPITAL_BASED_OUTPATIENT_CLINIC_OR_DEPARTMENT_OTHER): Payer: Medicare Other | Admitting: Hematology

## 2021-08-28 ENCOUNTER — Inpatient Hospital Stay: Payer: Medicare Other | Attending: Hematology

## 2021-08-28 VITALS — BP 160/92 | HR 78 | Temp 97.9°F | Resp 18 | Ht 67.0 in | Wt 177.3 lb

## 2021-08-28 DIAGNOSIS — D472 Monoclonal gammopathy: Secondary | ICD-10-CM | POA: Diagnosis not present

## 2021-08-28 DIAGNOSIS — I10 Essential (primary) hypertension: Secondary | ICD-10-CM | POA: Diagnosis not present

## 2021-08-28 DIAGNOSIS — D509 Iron deficiency anemia, unspecified: Secondary | ICD-10-CM | POA: Diagnosis not present

## 2021-08-28 DIAGNOSIS — D638 Anemia in other chronic diseases classified elsewhere: Secondary | ICD-10-CM | POA: Insufficient documentation

## 2021-08-28 LAB — CMP (CANCER CENTER ONLY)
ALT: 19 U/L (ref 0–44)
AST: 23 U/L (ref 15–41)
Albumin: 3.3 g/dL — ABNORMAL LOW (ref 3.5–5.0)
Alkaline Phosphatase: 75 U/L (ref 38–126)
Anion gap: 12 (ref 5–15)
BUN: 20 mg/dL (ref 8–23)
CO2: 22 mmol/L (ref 22–32)
Calcium: 9.1 mg/dL (ref 8.9–10.3)
Chloride: 105 mmol/L (ref 98–111)
Creatinine: 1.05 mg/dL — ABNORMAL HIGH (ref 0.44–1.00)
GFR, Estimated: 56 mL/min — ABNORMAL LOW (ref >60)
Glucose, Bld: 104 mg/dL — ABNORMAL HIGH (ref 70–99)
Potassium: 4.5 mmol/L (ref 3.5–5.1)
Sodium: 139 mmol/L (ref 135–145)
Total Bilirubin: 0.4 mg/dL (ref 0.3–1.2)
Total Protein: 7.1 g/dL (ref 6.5–8.1)

## 2021-08-28 LAB — FERRITIN: Ferritin: 177 ng/mL (ref 11–307)

## 2021-08-28 LAB — CBC WITH DIFFERENTIAL/PLATELET
Abs Immature Granulocytes: 0.01 10*3/uL (ref 0.00–0.07)
Basophils Absolute: 0 10*3/uL (ref 0.0–0.1)
Basophils Relative: 0 %
Eosinophils Absolute: 0.2 10*3/uL (ref 0.0–0.5)
Eosinophils Relative: 2 %
HCT: 42.7 % (ref 36.0–46.0)
Hemoglobin: 13.8 g/dL (ref 12.0–15.0)
Immature Granulocytes: 0 %
Lymphocytes Relative: 16 %
Lymphs Abs: 1.2 10*3/uL (ref 0.7–4.0)
MCH: 29.7 pg (ref 26.0–34.0)
MCHC: 32.3 g/dL (ref 30.0–36.0)
MCV: 92 fL (ref 80.0–100.0)
Monocytes Absolute: 0.5 10*3/uL (ref 0.1–1.0)
Monocytes Relative: 6 %
Neutro Abs: 5.7 10*3/uL (ref 1.7–7.7)
Neutrophils Relative %: 76 %
Platelets: 248 10*3/uL (ref 150–400)
RBC: 4.64 MIL/uL (ref 3.87–5.11)
RDW: 12.4 % (ref 11.5–15.5)
WBC: 7.6 10*3/uL (ref 4.0–10.5)
nRBC: 0 % (ref 0.0–0.2)

## 2021-08-28 LAB — IRON AND TIBC
Iron: 128 ug/dL (ref 28–170)
Saturation Ratios: 50 % — ABNORMAL HIGH (ref 10.4–31.8)
TIBC: 256 ug/dL (ref 250–450)
UIBC: 128 ug/dL

## 2021-08-28 NOTE — Progress Notes (Signed)
[place]   Telephone:(336) [phone] Fax:(336) [phone]   Clinic Follow up Note   Patient Care Team: Christa See, FNP as PCP - General (Family Medicine) [name], NP as Nurse Practitioner (Nurse Practitioner) [name], MD as Consulting Physician (Hematology) [name], MD as Consulting Physician (Gastroenterology)  Date of Service:  08/28/2021  CHIEF COMPLAINT: f/u of anemia, MGUS  CURRENT THERAPY:  IV iron as needed. [name] on 01/01/20 and IV Venofer since 07/05/20. Last dose 07/14/20.  ASSESSMENT & PLAN:  Rebecca Chen is a 75 y.o. female with   1. Anemia of Chronic disease and history of iron deficiency -She previously had iron deficient anemia, and resolved after iron supplement in 2020 -She had a recurrent anemia in August 2021 after upper respiratory infection and antibiotics. Endo/colonoscopy by Dr. Paulita Fujita was negative. -She was treated with 4 doses of IV Venofer in 06/2020 and responded well but anemia did not resolve. -07/2020 Labs showed low TIBC which supports anemia of chronic disease. Other lab work was negative for other nutritional anemia. No lab evidence of hemolysis -Hgb within normal range now, since 02/19/21. She has not required IV iron since 06/2020. -her labs today are not back. Since she will be unreachable after Friday, we will send her a MyChart message with her results.   2. MGUS -During lab work up for her anemia, 08/11/20 SPEP showed positive M protein at the low level, 0.6g/dl, elevated kappa and lambda light chain level, which is nonspecific.  -She underwent Bone marrow biopsy on 09/01/20. Pathology was overall negative, but did show 5% plasmacytosis. Cytogenetics was normal -Her M protein has dropped to 0.22 months ago, light chain level also spontaneously improved, will continue monitoring.    PLAN:  -labs in 6 and 12 months -phone visit in 6 months, a week after labs -office visit in 12 months, a week after  labs   No problem-specific Assessment & Plan notes found for this encounter.   INTERVAL HISTORY:  Rebecca Chen is here for a follow up of anemia and MGUS. She was last seen by me on 04/23/21. She presents to the clinic alone. She reports she is doing well overall. She notes she had bronchitis over Thanksgiving, and this has resolved. She notes she is leaving the country from this Friday through 12/15. She notes she is going on a cruise with a study group.   All other systems were reviewed with the patient and are negative.  MEDICAL HISTORY:  Past Medical History:  Diagnosis Date   AKI (acute kidney injury) ([place]) 01/01/2019   Hypertension     SURGICAL HISTORY: Past Surgical History:  Procedure Laterality Date   FOOT SURGERY     IR IVC FILTER PLMT / S&I /IMG GUID/MOD SED  01/01/2019   IR IVC FILTER RETRIEVAL / S&I /IMG GUID/MOD SED  08/09/2019   IR RADIOLOGIST EVAL & MGMT  07/21/2019    I have reviewed the social history and family history with the patient and they are unchanged from previous note.  ALLERGIES:  is allergic to mixed vespid venom and bee venom.  MEDICATIONS:  Current Outpatient Medications  Medication Sig Dispense Refill   Ascorbic Acid (VITAMIN C PO) Take 1 tablet by mouth daily.      atenolol (TENORMIN) 50 MG tablet Take 50 mg by mouth daily.     CALCIUM PO Take 1 tablet by mouth daily.      cephALEXin (KEFLEX) 500 MG capsule Take 1 capsule (500  mg total) by mouth 2 (two) times daily. 20 capsule 0   CRANBERRY PO Take 1 tablet by mouth daily. (Patient not taking: Reported on 06/28/2020)     Cyanocobalamin (VITAMIN B-12 PO) Take 1 tablet by mouth daily.      EPINEPHrine (EPIPEN 2-PAK) 0.3 mg/0.3 mL IJ SOAJ injection Inject 0.3 mLs (0.3 mg total) into the muscle as needed for anaphylaxis. 1 each 0   FEROSUL 325 (65 Fe) MG tablet TAKE 1 TABLET BY MOUTH ONCE OR TWICE DAILY, WITH VITAMIN-C 60 tablet 1   fluorouracil (EFUDEX) 5 % cream Apply 1 application topically  daily for 14 days 40 g 0   NIACIN PO Take 1 tablet by mouth daily. (Patient not taking: Reported on 06/28/2020)     pantoprazole (PROTONIX) 40 MG tablet Take 1 tablet (40 mg total) by mouth daily. (Patient not taking: Reported on 09/01/2020) 30 tablet 0   predniSONE (DELTASONE) 10 MG tablet Take 2 tablets (20 mg total) by mouth 2 (two) times daily with a meal. (Patient not taking: Reported on 06/28/2020) 12 tablet 0   Pyridoxine HCl (VITAMIN B-6 PO) Take 1 tablet by mouth daily.      simvastatin (ZOCOR) 40 MG tablet Take 40 mg by mouth every evening.      VITAMIN D PO Take 1 tablet by mouth daily.     VITAMIN E PO Take 1 tablet by mouth daily. (Patient not taking: Reported on 06/28/2020)     zolpidem (AMBIEN) 10 MG tablet Take 10 mg by mouth at bedtime as needed for sleep.      No current facility-administered medications for this visit.    PHYSICAL EXAMINATION: ECOG PERFORMANCE STATUS: 0 - Asymptomatic  Vitals:   08/28/21 1246  BP: (!) 160/92  Pulse: 78  Resp: 18  Temp: 97.9 F (36.6 C)  SpO2: 97%   Wt Readings from Last 3 Encounters:  08/28/21 177 lb 4.8 oz (80.4 kg)  09/01/20 165 lb (74.8 kg)  08/03/20 162 lb (73.5 kg)     GENERAL:alert, no distress and comfortable SKIN: skin color, texture, turgor are normal, no rashes or significant lesions EYES: normal, Conjunctiva are pink and non-injected, sclera clear  NECK: supple, thyroid normal size, non-tender, without nodularity LYMPH:  no palpable lymphadenopathy in the cervical, axillary  LUNGS: clear to auscultation and percussion with normal breathing effort HEART: regular rate & rhythm and no murmurs and no lower extremity edema ABDOMEN:abdomen soft, non-tender and normal bowel sounds Musculoskeletal:no cyanosis of digits and no clubbing  NEURO: alert & oriented x 3 with fluent speech, no focal motor/sensory deficits  LABORATORY DATA:  I have reviewed the data as listed CBC Latest Ref Rng & Units 08/28/2021 04/23/2021  02/19/2021  WBC 4.0 - 10.5 K/uL 7.6 7.2 7.6  Hemoglobin 12.0 - 15.0 g/dL 13.8 12.4 12.8  Hematocrit 36.0 - 46.0 % 42.7 37.7 39.7  Platelets 150 - 400 K/uL 248 250 262     CMP Latest Ref Rng & Units 08/28/2021 06/08/2020 08/12/2019  Glucose 70 - 99 mg/dL 104(H) 118(H) 155(H)  BUN 8 - 23 mg/dL 20 9 29(H)  Creatinine 0.44 - 1.00 mg/dL 1.05(H) 0.98 1.20(H)  Sodium 135 - 145 mmol/L 139 137 138  Potassium 3.5 - 5.1 mmol/L 4.5 4.0 4.1  Chloride 98 - 111 mmol/L 105 102 105  CO2 22 - 32 mmol/L $RemoveB'22 27 23  '[id number]$ Calcium 8.9 - 10.3 mg/dL 9.1 8.4(L) 8.8(L)  Total Protein 6.5 - 8.1 g/dL 7.1 7.1 -  Total Bilirubin  0.3 - 1.2 mg/dL 0.4 0.3 -  Alkaline Phos 38 - 126 U/L 75 73 -  AST 15 - 41 U/L 23 21 -  ALT 0 - 44 U/L 19 19 -      RADIOGRAPHIC STUDIES: I have personally reviewed the radiological images as listed and agreed with the findings in the report. No results found.    Orders Placed This Encounter  Procedures   CBC with Differential/Platelet    Standing Status:   Standing    Number of Occurrences:   50    Standing Expiration Date:   08/28/2022   Comprehensive metabolic panel    Standing Status:   Standing    Number of Occurrences:   50    Standing Expiration Date:   08/28/2022   Multiple Myeloma Panel (SPEP&IFE w/QIG)    Standing Status:   Standing    Number of Occurrences:   20    Standing Expiration Date:   08/28/2022   Kappa/lambda light chains    Standing Status:   Standing    Number of Occurrences:   20    Standing Expiration Date:   08/28/2022   Ferritin    Standing Status:   Standing    Number of Occurrences:   20    Standing Expiration Date:   08/28/2022   Iron and TIBC    Standing Status:   Standing    Number of Occurrences:   20    Standing Expiration Date:   08/28/2022   All questions were answered. The patient knows to call the clinic with any problems, questions or concerns. No barriers to learning was detected. The total time spent in the appointment was 20  minutes.     [name], MD 08/28/2021   I, Wilburn Mylar, am acting as scribe for [name], MD.   I have reviewed the above documentation for accuracy and completeness, and I agree with the above.

## 2021-08-29 ENCOUNTER — Other Ambulatory Visit: Payer: Self-pay

## 2021-08-29 LAB — PROTEIN ELECTROPHORESIS, SERUM, WITH REFLEX
A/G Ratio: 0.9 (ref 0.7–1.7)
Albumin ELP: 3.2 g/dL (ref 2.9–4.4)
Alpha-1-Globulin: 0.2 g/dL (ref 0.0–0.4)
Alpha-2-Globulin: 0.9 g/dL (ref 0.4–1.0)
Beta Globulin: 1.5 g/dL — ABNORMAL HIGH (ref 0.7–1.3)
Gamma Globulin: 1 g/dL (ref 0.4–1.8)
Globulin, Total: 3.6 g/dL (ref 2.2–3.9)
Total Protein ELP: 6.8 g/dL (ref 6.0–8.5)

## 2021-08-29 LAB — KAPPA/LAMBDA LIGHT CHAINS
Kappa free light chain: 52.1 mg/L — ABNORMAL HIGH (ref 3.3–19.4)
Kappa, lambda light chain ratio: 1.89 — ABNORMAL HIGH (ref 0.26–1.65)
Lambda free light chains: 27.5 mg/L — ABNORMAL HIGH (ref 5.7–26.3)

## 2021-08-29 NOTE — Addendum Note (Signed)
Addended by: Evalee Jefferson on: 08/29/2021 09:04 AM   Modules accepted: Orders

## 2021-08-29 NOTE — Progress Notes (Signed)
Updated medication list per Dr Ernestina Penna request

## 2021-08-30 ENCOUNTER — Telehealth: Payer: Self-pay | Admitting: Hematology

## 2021-08-30 NOTE — Telephone Encounter (Signed)
Scheduled follow-up appointments per 11/29 los. Patient is aware. 

## 2021-10-11 DIAGNOSIS — L089 Local infection of the skin and subcutaneous tissue, unspecified: Secondary | ICD-10-CM | POA: Diagnosis not present

## 2021-12-31 ENCOUNTER — Ambulatory Visit
Admission: RE | Admit: 2021-12-31 | Discharge: 2021-12-31 | Disposition: A | Discharge status: Home / Self Care | Payer: Medicare Other | Source: Ambulatory Visit | Attending: Family Medicine | Admitting: Family Medicine

## 2021-12-31 DIAGNOSIS — Z780 Asymptomatic menopausal state: Secondary | ICD-10-CM | POA: Diagnosis not present

## 2021-12-31 DIAGNOSIS — E2839 Other primary ovarian failure: Secondary | ICD-10-CM

## 2021-12-31 DIAGNOSIS — M85851 Other specified disorders of bone density and structure, right thigh: Secondary | ICD-10-CM | POA: Diagnosis not present

## 2022-01-07 DIAGNOSIS — J4530 Mild persistent asthma, uncomplicated: Secondary | ICD-10-CM | POA: Diagnosis not present

## 2022-01-07 DIAGNOSIS — R058 Other specified cough: Secondary | ICD-10-CM | POA: Diagnosis not present

## 2022-01-14 DIAGNOSIS — J4530 Mild persistent asthma, uncomplicated: Secondary | ICD-10-CM | POA: Diagnosis not present

## 2022-01-14 DIAGNOSIS — R058 Other specified cough: Secondary | ICD-10-CM | POA: Diagnosis not present

## 2022-01-14 DIAGNOSIS — Z20822 Contact with and (suspected) exposure to covid-19: Secondary | ICD-10-CM | POA: Diagnosis not present

## 2022-01-17 ENCOUNTER — Other Ambulatory Visit: Payer: Self-pay | Admitting: Family Medicine

## 2022-01-17 ENCOUNTER — Ambulatory Visit
Admission: RE | Admit: 2022-01-17 | Discharge: 2022-01-17 | Disposition: A | Discharge status: Home / Self Care | Payer: Medicare Other | Source: Ambulatory Visit | Attending: Family Medicine | Admitting: Family Medicine

## 2022-01-17 DIAGNOSIS — R059 Cough, unspecified: Secondary | ICD-10-CM | POA: Diagnosis not present

## 2022-01-17 DIAGNOSIS — R058 Other specified cough: Secondary | ICD-10-CM

## 2022-01-30 ENCOUNTER — Telehealth: Payer: Self-pay | Admitting: Hematology

## 2022-01-30 NOTE — Telephone Encounter (Signed)
Left message with rescheduled upcoming appointment due to provider's breast clinic. 

## 2022-02-06 ENCOUNTER — Other Ambulatory Visit: Payer: Self-pay

## 2022-02-06 ENCOUNTER — Inpatient Hospital Stay: Payer: Medicare Other | Attending: Hematology

## 2022-02-06 DIAGNOSIS — I10 Essential (primary) hypertension: Secondary | ICD-10-CM | POA: Diagnosis not present

## 2022-02-06 DIAGNOSIS — D631 Anemia in chronic kidney disease: Secondary | ICD-10-CM | POA: Diagnosis not present

## 2022-02-06 DIAGNOSIS — D472 Monoclonal gammopathy: Secondary | ICD-10-CM | POA: Insufficient documentation

## 2022-02-06 DIAGNOSIS — D509 Iron deficiency anemia, unspecified: Secondary | ICD-10-CM

## 2022-02-06 LAB — CBC WITH DIFFERENTIAL/PLATELET
Abs Immature Granulocytes: 0.01 10*3/uL (ref 0.00–0.07)
Basophils Absolute: 0 10*3/uL (ref 0.0–0.1)
Basophils Relative: 1 %
Eosinophils Absolute: 0.3 10*3/uL (ref 0.0–0.5)
Eosinophils Relative: 5 %
HCT: 39.3 % (ref 36.0–46.0)
Hemoglobin: 12.8 g/dL (ref 12.0–15.0)
Immature Granulocytes: 0 %
Lymphocytes Relative: 28 %
Lymphs Abs: 1.5 10*3/uL (ref 0.7–4.0)
MCH: 31.3 pg (ref 26.0–34.0)
MCHC: 32.6 g/dL (ref 30.0–36.0)
MCV: 96.1 fL (ref 80.0–100.0)
Monocytes Absolute: 0.6 10*3/uL (ref 0.1–1.0)
Monocytes Relative: 11 %
Neutro Abs: 3 10*3/uL (ref 1.7–7.7)
Neutrophils Relative %: 55 %
Platelets: 244 10*3/uL (ref 150–400)
RBC: 4.09 MIL/uL (ref 3.87–5.11)
RDW: 12.9 % (ref 11.5–15.5)
WBC: 5.5 10*3/uL (ref 4.0–10.5)
nRBC: 0 % (ref 0.0–0.2)

## 2022-02-06 LAB — COMPREHENSIVE METABOLIC PANEL
ALT: 15 U/L (ref 0–44)
AST: 19 U/L (ref 15–41)
Albumin: 4.1 g/dL (ref 3.5–5.0)
Alkaline Phosphatase: 71 U/L (ref 38–126)
Anion gap: 8 (ref 5–15)
BUN: 23 mg/dL (ref 8–23)
CO2: 27 mmol/L (ref 22–32)
Calcium: 9.5 mg/dL (ref 8.9–10.3)
Chloride: 106 mmol/L (ref 98–111)
Creatinine, Ser: 1.19 mg/dL — ABNORMAL HIGH (ref 0.44–1.00)
GFR, Estimated: 48 mL/min — ABNORMAL LOW (ref >60)
Glucose, Bld: 112 mg/dL — ABNORMAL HIGH (ref 70–99)
Potassium: 4.3 mmol/L (ref 3.5–5.1)
Sodium: 141 mmol/L (ref 135–145)
Total Bilirubin: 0.5 mg/dL (ref 0.3–1.2)
Total Protein: 7.5 g/dL (ref 6.5–8.1)

## 2022-02-06 LAB — FERRITIN: Ferritin: 71 ng/mL (ref 11–307)

## 2022-02-07 LAB — KAPPA/LAMBDA LIGHT CHAINS
Kappa free light chain: 43.9 mg/L — ABNORMAL HIGH (ref 3.3–19.4)
Kappa, lambda light chain ratio: 2.2 — ABNORMAL HIGH (ref 0.26–1.65)
Lambda free light chains: 20 mg/L (ref 5.7–26.3)

## 2022-02-11 LAB — MULTIPLE MYELOMA PANEL, SERUM
Albumin SerPl Elph-Mcnc: 3.7 g/dL (ref 2.9–4.4)
Albumin/Glob SerPl: 1.3 (ref 0.7–1.7)
Alpha 1: 0.2 g/dL (ref 0.0–0.4)
Alpha2 Glob SerPl Elph-Mcnc: 0.6 g/dL (ref 0.4–1.0)
B-Globulin SerPl Elph-Mcnc: 1.3 g/dL (ref 0.7–1.3)
Gamma Glob SerPl Elph-Mcnc: 0.9 g/dL (ref 0.4–1.8)
Globulin, Total: 2.9 g/dL (ref 2.2–3.9)
IgA: 266 mg/dL (ref 64–422)
IgG (Immunoglobin G), Serum: 1206 mg/dL (ref 586–1602)
IgM (Immunoglobulin M), Srm: 99 mg/dL (ref 26–217)
Total Protein ELP: 6.6 g/dL (ref 6.0–8.5)

## 2022-02-12 ENCOUNTER — Ambulatory Visit: Payer: Medicare Other | Admitting: Hematology

## 2022-02-13 ENCOUNTER — Ambulatory Visit: Payer: Medicare Other | Admitting: Hematology

## 2022-02-14 DIAGNOSIS — Z23 Encounter for immunization: Secondary | ICD-10-CM | POA: Diagnosis not present

## 2022-02-21 ENCOUNTER — Encounter: Payer: Self-pay | Admitting: Hematology

## 2022-02-21 ENCOUNTER — Inpatient Hospital Stay (HOSPITAL_BASED_OUTPATIENT_CLINIC_OR_DEPARTMENT_OTHER): Payer: Medicare Other | Admitting: Hematology

## 2022-02-21 DIAGNOSIS — D472 Monoclonal gammopathy: Secondary | ICD-10-CM | POA: Diagnosis not present

## 2022-02-21 DIAGNOSIS — D509 Iron deficiency anemia, unspecified: Secondary | ICD-10-CM

## 2022-02-21 NOTE — Progress Notes (Signed)
[place]   Telephone:(336) [phone] Fax:(336) [phone]   Clinic Follow up Note   Patient Care Team: Christa See, FNP as PCP - General (Family Medicine) [name], NP as Nurse Practitioner (Nurse Practitioner) [name], MD as Consulting Physician (Hematology) [name], MD as Consulting Physician (Gastroenterology)  Date of Service:  02/21/2022  I connected with Rebecca Chen on 02/21/2022 at  8:40 AM EDT by telephone visit and verified that I am speaking with the correct person using two identifiers.  I discussed the limitations, risks, security and privacy concerns of performing an evaluation and management service by telephone and the availability of in person appointments. I also discussed with the patient that there may be a patient responsible charge related to this service. The patient expressed understanding and agreed to proceed.   Other persons participating in the visit and their role in the encounter:  none  Patient's location:  home Provider's location:  my office  CHIEF COMPLAINT: f/u of anemia, MGUS  CURRENT THERAPY:  Surveillance  ASSESSMENT & PLAN:  Rebecca Chen is a 76 y.o. female with   1. Anemia of Chronic disease and history of iron deficiency -She previously had iron deficient anemia, and resolved after iron supplement in 2020 -She had a recurrent anemia in August 2021 after upper respiratory infection and antibiotics. Endo/colonoscopy by Dr. Paulita Fujita was negative. -She was treated with 4 doses of IV Venofer in 06/2020 and responded well but anemia did not resolve. -07/2020 Labs showed low TIBC which supports anemia of chronic disease. Other lab work was negative for other nutritional anemia. No lab evidence of hemolysis -Hgb within normal range now, since 02/19/21. She has not required IV iron since 06/2020. -labs from 02/06/22 reviewed, her CBC remains WNL. She is clinically doing very well.   2. MGUS -During lab work up  for her anemia, 08/11/20 SPEP showed positive M protein at the low level, 0.6g/dl, elevated kappa and lambda light chain level, which is nonspecific.  -She underwent Bone marrow biopsy on 09/01/20. Pathology was overall negative, but did show 5% plasmacytosis. Cytogenetics was normal -Her M protein is now undetectable, and her light chain level is also spontaneously improving (last 43.9 on 02/06/22). We discussed that this is probably non-specific inflammatory related, I think her risk of multiple myeloma is very low     PLAN:  -labs in 12 months with f/u a week later, may release her after next visit    No problem-specific Assessment & Plan notes found for this encounter.   INTERVAL HISTORY:  Rebecca Chen was contacted for a follow up of anemia and MGUS. She was last seen by me on 08/28/21. She reports she is doing well overall. She notes she had two episodes of bronchitis since her last visit, but she has recovered well. She reports her energy level is good.    All other systems were reviewed with the patient and are negative.  MEDICAL HISTORY:  Past Medical History:  Diagnosis Date   AKI (acute kidney injury) ([place]) 01/01/2019   Hypertension     SURGICAL HISTORY: Past Surgical History:  Procedure Laterality Date   FOOT SURGERY     IR IVC FILTER PLMT / S&I /IMG GUID/MOD SED  01/01/2019   IR IVC FILTER RETRIEVAL / S&I /IMG GUID/MOD SED  08/09/2019   IR RADIOLOGIST EVAL & MGMT  07/21/2019    I have reviewed the social history and family history with the patient and they are  unchanged from previous note.  ALLERGIES:  is allergic to mixed vespid venom and bee venom.  MEDICATIONS:  Current Outpatient Medications  Medication Sig Dispense Refill   Ascorbic Acid (VITAMIN C PO) Take 1 tablet by mouth daily.      atenolol (TENORMIN) 50 MG tablet Take 50 mg by mouth daily.     CALCIUM PO Take 1 tablet by mouth daily.      EPINEPHrine 0.3 mg/0.3 mL IJ SOAJ injection Inject 0.3 mg into  the muscle as needed.     FEROSUL 325 (65 Fe) MG tablet TAKE 1 TABLET BY MOUTH ONCE OR TWICE DAILY, WITH VITAMIN-C 60 tablet 1   omeprazole (PRILOSEC) 40 MG capsule Take 40 mg by mouth daily.     simvastatin (ZOCOR) 40 MG tablet Take 40 mg by mouth every evening.      VITAMIN D PO Take 1 tablet by mouth daily.     zolpidem (AMBIEN) 10 MG tablet Take 10 mg by mouth at bedtime as needed. Take for jet lag     No current facility-administered medications for this visit.    PHYSICAL EXAMINATION: ECOG PERFORMANCE STATUS: 0 - Asymptomatic  There were no vitals filed for this visit. Wt Readings from Last 3 Encounters:  08/28/21 177 lb 4.8 oz (80.4 kg)  09/01/20 165 lb (74.8 kg)  08/03/20 162 lb (73.5 kg)     No vitals taken today, Exam not performed today  LABORATORY DATA:  I have reviewed the data as listed    Latest Ref Rng & Units 02/06/2022    7:43 AM 08/28/2021   12:19 PM 04/23/2021    8:00 AM  CBC  WBC 4.0 - 10.5 K/uL 5.5   7.6   7.2    Hemoglobin 12.0 - 15.0 g/dL 12.8   13.8   12.4    Hematocrit 36.0 - 46.0 % 39.3   42.7   37.7    Platelets 150 - 400 K/uL 244   248   250          Latest Ref Rng & Units 02/06/2022    7:43 AM 08/28/2021   12:19 PM 06/08/2020    8:36 PM  CMP  Glucose 70 - 99 mg/dL 112   104   118    BUN 8 - 23 mg/dL $Remove'23   20   9    'dCVVyGy$ Creatinine 0.44 - 1.00 mg/dL 1.19   1.05   0.98    Sodium 135 - 145 mmol/L 141   139   137    Potassium 3.5 - 5.1 mmol/L 4.3   4.5   4.0    Chloride 98 - 111 mmol/L 106   105   102    CO2 22 - 32 mmol/L $RemoveB'27   22   27    'tDLRzASI$ Calcium 8.9 - 10.3 mg/dL 9.5   9.1   8.4    Total Protein 6.5 - 8.1 g/dL 7.5   7.1   7.1    Total Bilirubin 0.3 - 1.2 mg/dL 0.5   0.4   0.3    Alkaline Phos 38 - 126 U/L 71   75   73    AST 15 - 41 U/L $Remo'19   23   21    'VRVrP$ ALT 0 - 44 U/L $Remo'15   19   19        'kSQjy$ RADIOGRAPHIC STUDIES: I have personally reviewed the radiological images as listed and agreed with the findings in the report. No results  found.    No  orders of the defined types were placed in this encounter.  All questions were answered. The patient knows to call the clinic with any problems, questions or concerns. No barriers to learning was detected. The total time spent in the appointment was 15 minutes.     [name], MD 02/21/2022   I, Wilburn Mylar, am acting as scribe for [name], MD.   I have reviewed the above documentation for accuracy and completeness, and I agree with the above.

## 2022-05-10 ENCOUNTER — Telehealth: Payer: Self-pay | Admitting: Hematology

## 2022-05-10 NOTE — Telephone Encounter (Signed)
Rescheduled upcoming appointments per 5/24 los. Patient is aware of changes.

## 2022-06-11 DIAGNOSIS — L814 Other melanin hyperpigmentation: Secondary | ICD-10-CM | POA: Diagnosis not present

## 2022-06-11 DIAGNOSIS — L578 Other skin changes due to chronic exposure to nonionizing radiation: Secondary | ICD-10-CM | POA: Diagnosis not present

## 2022-06-11 DIAGNOSIS — Z85828 Personal history of other malignant neoplasm of skin: Secondary | ICD-10-CM | POA: Diagnosis not present

## 2022-06-11 DIAGNOSIS — L821 Other seborrheic keratosis: Secondary | ICD-10-CM | POA: Diagnosis not present

## 2022-06-11 DIAGNOSIS — D225 Melanocytic nevi of trunk: Secondary | ICD-10-CM | POA: Diagnosis not present

## 2022-06-27 DIAGNOSIS — Z23 Encounter for immunization: Secondary | ICD-10-CM | POA: Diagnosis not present

## 2022-07-15 ENCOUNTER — Other Ambulatory Visit: Payer: Self-pay | Admitting: Family Medicine

## 2022-07-15 DIAGNOSIS — Z1231 Encounter for screening mammogram for malignant neoplasm of breast: Secondary | ICD-10-CM

## 2022-07-25 DIAGNOSIS — E2839 Other primary ovarian failure: Secondary | ICD-10-CM | POA: Diagnosis not present

## 2022-07-25 DIAGNOSIS — I10 Essential (primary) hypertension: Secondary | ICD-10-CM | POA: Diagnosis not present

## 2022-07-25 DIAGNOSIS — Z6830 Body mass index (BMI) 30.0-30.9, adult: Secondary | ICD-10-CM | POA: Diagnosis not present

## 2022-07-25 DIAGNOSIS — I700 Atherosclerosis of aorta: Secondary | ICD-10-CM | POA: Diagnosis not present

## 2022-07-25 DIAGNOSIS — D509 Iron deficiency anemia, unspecified: Secondary | ICD-10-CM | POA: Diagnosis not present

## 2022-07-25 DIAGNOSIS — E785 Hyperlipidemia, unspecified: Secondary | ICD-10-CM | POA: Diagnosis not present

## 2022-07-25 DIAGNOSIS — G43109 Migraine with aura, not intractable, without status migrainosus: Secondary | ICD-10-CM | POA: Diagnosis not present

## 2022-07-25 DIAGNOSIS — E559 Vitamin D deficiency, unspecified: Secondary | ICD-10-CM | POA: Diagnosis not present

## 2022-07-25 DIAGNOSIS — Z0000 Encounter for general adult medical examination without abnormal findings: Secondary | ICD-10-CM | POA: Diagnosis not present

## 2022-07-25 DIAGNOSIS — H35033 Hypertensive retinopathy, bilateral: Secondary | ICD-10-CM | POA: Diagnosis not present

## 2022-08-07 ENCOUNTER — Other Ambulatory Visit: Payer: Medicare Other

## 2022-08-14 ENCOUNTER — Ambulatory Visit: Payer: Medicare Other | Admitting: Hematology

## 2022-08-14 ENCOUNTER — Other Ambulatory Visit: Payer: Medicare Other

## 2022-08-20 ENCOUNTER — Telehealth: Payer: Self-pay | Admitting: Hematology

## 2022-08-20 NOTE — Telephone Encounter (Signed)
Called patient per 11/20 in basket. Left voicemail for patient to call us back to r/s

## 2022-08-20 NOTE — Telephone Encounter (Signed)
Patient called 11/21 to r/s may appointments due to new symptoms. R/s with patient/patient notified

## 2022-08-21 ENCOUNTER — Ambulatory Visit: Payer: Medicare Other | Admitting: Hematology

## 2022-08-29 ENCOUNTER — Other Ambulatory Visit: Payer: Self-pay | Admitting: Family Medicine

## 2022-08-29 ENCOUNTER — Ambulatory Visit
Admission: RE | Admit: 2022-08-29 | Discharge: 2022-08-29 | Disposition: A | Discharge status: Home / Self Care | Payer: Medicare Other | Source: Ambulatory Visit | Attending: Family Medicine | Admitting: Family Medicine

## 2022-08-29 DIAGNOSIS — Z1231 Encounter for screening mammogram for malignant neoplasm of breast: Secondary | ICD-10-CM

## 2022-08-29 DIAGNOSIS — Z6830 Body mass index (BMI) 30.0-30.9, adult: Secondary | ICD-10-CM | POA: Diagnosis not present

## 2022-08-29 DIAGNOSIS — R42 Dizziness and giddiness: Secondary | ICD-10-CM | POA: Diagnosis not present

## 2022-08-29 DIAGNOSIS — Z862 Personal history of diseases of the blood and blood-forming organs and certain disorders involving the immune mechanism: Secondary | ICD-10-CM | POA: Diagnosis not present

## 2022-08-29 DIAGNOSIS — R079 Chest pain, unspecified: Secondary | ICD-10-CM | POA: Diagnosis not present

## 2022-08-30 DIAGNOSIS — Z23 Encounter for immunization: Secondary | ICD-10-CM | POA: Diagnosis not present

## 2022-09-02 ENCOUNTER — Ambulatory Visit
Admission: RE | Admit: 2022-09-02 | Discharge: 2022-09-02 | Disposition: A | Discharge status: Home / Self Care | Payer: Medicare Other | Source: Ambulatory Visit | Attending: Family Medicine | Admitting: Family Medicine

## 2022-09-02 DIAGNOSIS — G43909 Migraine, unspecified, not intractable, without status migrainosus: Secondary | ICD-10-CM | POA: Diagnosis not present

## 2022-09-02 DIAGNOSIS — I672 Cerebral atherosclerosis: Secondary | ICD-10-CM | POA: Diagnosis not present

## 2022-09-02 DIAGNOSIS — R42 Dizziness and giddiness: Secondary | ICD-10-CM

## 2022-09-03 NOTE — Progress Notes (Unsigned)
Cardiology Office Note:    Date:  09/04/2022   ID:  Rebecca Chen, DOB Mar 21, 1946, MRN [id number]  PCP:  Rebecca Chen, [place] Providers Cardiologist:  None     Referring MD: Rebecca Stalker, PA-C   Chief Complaint  Patient presents with   Chest Pain    History of Present Illness:    Rebecca Chen is a 76 y.o. female who is seen at the request of Rebecca Stalker PA-C for evaluation of chest pain. She has a history of HTN. She reports that 2 years ago she was having an increase in migraine HAs. Was found to be severely anemic with Hgb down to 5. No clear source of anemia. Managed by hematology and per patient Hgb returned to normal. During this time she noted SOB and palpitations.   More recently for the past 1-2 weeks she noted 5 HAs in one week. Evaluation again shows anemia down to 8.5. with this she has experienced symptoms of chest pain - substernal and subscapular. Also has SOB on exertion and palpitations where she feels her heart beating out of her chest. No dizziness. Prior to this she was able to participate in senior aerobics without difficulty. No prior cardiac evaluation. Risk factors include HLD, HTN and family history of CAD.   Past Medical History:  Diagnosis Date   AKI (acute kidney injury) ([place]) 01/01/2019   Anemia    Hyperlipidemia    Hypertension    Migraines    Monoclonal gammopathy     Past Surgical History:  Procedure Laterality Date   FOOT SURGERY     IR IVC FILTER PLMT / S&I /IMG GUID/MOD SED  01/01/2019   IR IVC FILTER RETRIEVAL / S&I /IMG GUID/MOD SED  08/09/2019   IR RADIOLOGIST EVAL & MGMT  07/21/2019    Current Medications: Current Meds  Medication Sig   Ascorbic Acid (VITAMIN C PO) Take 1 tablet by mouth daily.    atenolol (TENORMIN) 50 MG tablet Take 50 mg by mouth daily.   CALCIUM PO Take 1 tablet by mouth daily.    EPINEPHrine 0.3 mg/0.3 mL IJ SOAJ injection Inject 0.3 mg into the muscle as needed.   FEROSUL  325 (65 Fe) MG tablet TAKE 1 TABLET BY MOUTH ONCE OR TWICE DAILY, WITH VITAMIN-C   omeprazole (PRILOSEC) 40 MG capsule Take 40 mg by mouth daily.   simvastatin (ZOCOR) 40 MG tablet Take 40 mg by mouth every evening.    VITAMIN D PO Take 1 tablet by mouth daily.   zolpidem (AMBIEN) 10 MG tablet Take 10 mg by mouth at bedtime as needed. Take for jet lag     Allergies:   Mixed vespid venom and Bee venom   Social History   Socioeconomic History   Marital status: Single    Spouse name: Not on file   Number of children: Not on file   Years of education: Not on file   Highest education level: Not on file  Occupational History   Occupation: CRNA  Tobacco Use   Smoking status: Never   Smokeless tobacco: Never  Vaping Use   Vaping Use: Never used  Substance and Sexual Activity   Alcohol use: Not Currently    Alcohol/week: 0.0 standard drinks of alcohol   Drug use: Never   Sexual activity: Not on file  Other Topics Concern   Not on file  Social History Narrative   Not on file   Social Determinants of Health  Financial Resource Strain: Not on file  Food Insecurity: Not on file  Transportation Needs: Not on file  Physical Activity: Not on file  Stress: Not on file  Social Connections: Not on file     Family History: The patient's family history includes Heart attack in her father and mother; Heart attack (age of onset: 77) in her brother; Heart disease in her father and mother; Lymphoma in her brother and mother.  ROS:   Please Chen the history of present illness.     All other systems reviewed and are negative.  EKGs/Labs/Other Studies Reviewed:    The following studies were reviewed today: none  EKG:  EKG is not  ordered today.  Ecg normal per PCP  Recent Labs: 02/06/2022: ALT 15; BUN 23; Creatinine, Ser 1.19; Hemoglobin 12.8; Platelets 244; Potassium 4.3; Sodium 141  Recent Lipid Panel No results found for: "CHOL", "TRIG", "HDL", "CHOLHDL", "VLDL", "LDLCALC",  "LDLDIRECT"  Dated 07/25/22: cholesterol 153, triglycerides 247, HDL 47, LDL 66. ALT normal Dated 08/29/22: Hgb 8.5. creatinine 1.24. potassium 5.0. TSH normal.   Risk Assessment/Calculations:                Physical Exam:    VS:  BP 139/76 (BP Location: Left Arm, Patient Position: Sitting, Cuff Size: Large)   Pulse 71   Ht '5\' 8"'$  (1.727 m)   Wt 189 lb (85.7 kg)   SpO2 96%   BMI 28.74 kg/m     Wt Readings from Last 3 Encounters:  09/04/22 189 lb (85.7 kg)  08/28/21 177 lb 4.8 oz (80.4 kg)  09/01/20 165 lb (74.8 kg)     GEN:  Well nourished, well developed in no acute distress HEENT: Normal NECK: No JVD; No carotid bruits LYMPHATICS: No lymphadenopathy CARDIAC: RRR, no murmurs, rubs, gallops RESPIRATORY:  Clear to auscultation without rales, wheezing or rhonchi  ABDOMEN: Soft, non-tender, non-distended MUSCULOSKELETAL:  No edema; No deformity  SKIN: Warm and dry NEUROLOGIC:  Alert and oriented x 3 PSYCHIATRIC:  Normal affect   ASSESSMENT:    1. Chest pain of uncertain etiology   2. Primary hypertension   3. Mixed hyperlipidemia    PLAN:    In order of problems listed above:  Patient is at intermediate risk for CAD. Symptoms likely exacerbated by recurrent anemia. I do feel ischemic evaluation is warranted. Discussed options including coronary CTA versus nuclear stress testing. I would try and avoid contrast given her CKD so we will arrange a Lexiscan myoview first.  Anemia - per hematology HTN controlled Mixed HLD on statin.       Shared Decision Making/Informed Consent The risks [chest pain, shortness of breath, cardiac arrhythmias, dizziness, blood pressure fluctuations, myocardial infarction, stroke/transient ischemic attack, nausea, vomiting, allergic reaction, radiation exposure, metallic taste sensation and life-threatening complications (estimated to be 1 in 10,000)], benefits (risk stratification, diagnosing coronary artery disease, treatment guidance)  and alternatives of a nuclear stress test were discussed in detail with Rebecca Chen and she agrees to proceed.    Medication Adjustments/Labs and Tests Ordered: Current medicines are reviewed at length with the patient today.  Concerns regarding medicines are outlined above.  Orders Placed This Encounter  Procedures   Cardiac Stress Test: Informed Consent Details: Physician/Practitioner Attestation; Transcribe to consent form and obtain patient signature   No orders of the defined types were placed in this encounter.   There are no Patient Instructions on file for this visit.   Signed, Hatsue Sime Martinique, MD  09/04/2022 3:24 PM  [place]

## 2022-09-04 ENCOUNTER — Ambulatory Visit: Payer: Medicare Other | Attending: Cardiology | Admitting: Cardiology

## 2022-09-04 ENCOUNTER — Encounter: Payer: Self-pay | Admitting: Cardiology

## 2022-09-04 VITALS — BP 139/76 | HR 71 | Ht 68.0 in | Wt 189.0 lb

## 2022-09-04 DIAGNOSIS — R079 Chest pain, unspecified: Secondary | ICD-10-CM | POA: Diagnosis not present

## 2022-09-04 DIAGNOSIS — E782 Mixed hyperlipidemia: Secondary | ICD-10-CM | POA: Diagnosis not present

## 2022-09-04 DIAGNOSIS — I10 Essential (primary) hypertension: Secondary | ICD-10-CM | POA: Diagnosis not present

## 2022-09-04 NOTE — Patient Instructions (Signed)
Medication Instructions:  Continue same medications   Lab Work: None ordered   Testing/Procedures: Lexicographer   Follow-Up: At [facility], you and your health needs are our priority.  As part of our continuing mission to provide you with exceptional heart care, we have created designated Provider Care Teams.  These Care Teams include your primary Cardiologist (physician) and Advanced Practice Providers (APPs -  Physician Assistants and Nurse Practitioners) who all work together to provide you with the care you need, when you need it.  We recommend signing up for the patient portal called "MyChart".  Sign up information is provided on this After Visit Summary.  MyChart is used to connect with patients for Virtual Visits (Telemedicine).  Patients are able to view lab/test results, encounter notes, upcoming appointments, etc.  Non-urgent messages can be sent to your provider as well.   To learn more about what you can do with MyChart, go to [email or web address].    Your next appointment:  To Be Determined after test    The format for your next appointment: Office    Provider: Dr.Jordan    Important Information About Sugar

## 2022-09-06 ENCOUNTER — Telehealth (HOSPITAL_COMMUNITY): Payer: Self-pay

## 2022-09-06 NOTE — Telephone Encounter (Signed)
The patient called to get instructions for her test.  Everything was explained, she stated that she would be here for her test. Asked to call back with any questions. S.Shardae Kleinman EMTP

## 2022-09-10 ENCOUNTER — Other Ambulatory Visit (HOSPITAL_COMMUNITY): Payer: Self-pay | Admitting: Cardiology

## 2022-09-10 ENCOUNTER — Ambulatory Visit (HOSPITAL_COMMUNITY): Payer: Medicare Other

## 2022-09-10 ENCOUNTER — Ambulatory Visit (HOSPITAL_COMMUNITY): Payer: Medicare Other | Attending: Cardiology

## 2022-09-10 ENCOUNTER — Encounter (HOSPITAL_COMMUNITY): Payer: Self-pay

## 2022-09-10 VITALS — Ht 68.0 in | Wt 189.0 lb

## 2022-09-10 DIAGNOSIS — E782 Mixed hyperlipidemia: Secondary | ICD-10-CM | POA: Insufficient documentation

## 2022-09-10 DIAGNOSIS — I10 Essential (primary) hypertension: Secondary | ICD-10-CM | POA: Insufficient documentation

## 2022-09-10 DIAGNOSIS — R079 Chest pain, unspecified: Secondary | ICD-10-CM

## 2022-09-10 MED ORDER — TECHNETIUM TC 99M TETROFOSMIN IV KIT
10.5000 | PACK | Freq: Once | INTRAVENOUS | Status: AC | PRN
  Administered 2022-09-10: 10.5 via INTRAVENOUS

## 2022-09-10 MED ORDER — REGADENOSON 0.4 MG/5ML IV SOLN
0.4000 mg | Freq: Once | INTRAVENOUS | Status: AC
  Administered 2022-09-10: 0.4 mg via INTRAVENOUS

## 2022-09-10 MED ORDER — TECHNETIUM TC 99M TETROFOSMIN IV KIT
31.5000 | PACK | Freq: Once | INTRAVENOUS | Status: AC | PRN
  Administered 2022-09-10: 31.5 via INTRAVENOUS

## 2022-09-11 ENCOUNTER — Other Ambulatory Visit: Payer: Self-pay

## 2022-09-11 DIAGNOSIS — D509 Iron deficiency anemia, unspecified: Secondary | ICD-10-CM

## 2022-09-11 DIAGNOSIS — D472 Monoclonal gammopathy: Secondary | ICD-10-CM

## 2022-09-11 LAB — MYOCARDIAL PERFUSION IMAGING
Base ST Depression (mm): 0 mm
LV dias vol: 70 mL (ref 46–106)
LV sys vol: 34 mL
Nuc Stress EF: 52 %
Peak HR: 87 {beats}/min
Rest HR: 66 {beats}/min
Rest Nuclear Isotope Dose: 10.5 mCi
SDS: 1
SRS: 3
SSS: 4
ST Depression (mm): 0 mm
Stress Nuclear Isotope Dose: 31.5 mCi
TID: 0.88

## 2022-09-12 ENCOUNTER — Inpatient Hospital Stay: Payer: Medicare Other | Attending: Hematology

## 2022-09-12 DIAGNOSIS — D649 Anemia, unspecified: Secondary | ICD-10-CM | POA: Insufficient documentation

## 2022-09-12 DIAGNOSIS — D472 Monoclonal gammopathy: Secondary | ICD-10-CM | POA: Insufficient documentation

## 2022-09-12 DIAGNOSIS — I10 Essential (primary) hypertension: Secondary | ICD-10-CM | POA: Insufficient documentation

## 2022-09-12 DIAGNOSIS — R0609 Other forms of dyspnea: Secondary | ICD-10-CM | POA: Diagnosis not present

## 2022-09-12 DIAGNOSIS — D509 Iron deficiency anemia, unspecified: Secondary | ICD-10-CM

## 2022-09-12 LAB — CBC WITH DIFFERENTIAL (CANCER CENTER ONLY)
Abs Immature Granulocytes: 0.01 10*3/uL (ref 0.00–0.07)
Basophils Absolute: 0.1 10*3/uL (ref 0.0–0.1)
Basophils Relative: 1 %
Eosinophils Absolute: 0.3 10*3/uL (ref 0.0–0.5)
Eosinophils Relative: 5 %
HCT: 30.6 % — ABNORMAL LOW (ref 36.0–46.0)
Hemoglobin: 9.6 g/dL — ABNORMAL LOW (ref 12.0–15.0)
Immature Granulocytes: 0 %
Lymphocytes Relative: 13 %
Lymphs Abs: 0.7 10*3/uL (ref 0.7–4.0)
MCH: 30.9 pg (ref 26.0–34.0)
MCHC: 31.4 g/dL (ref 30.0–36.0)
MCV: 98.4 fL (ref 80.0–100.0)
Monocytes Absolute: 0.6 10*3/uL (ref 0.1–1.0)
Monocytes Relative: 11 %
Neutro Abs: 3.9 10*3/uL (ref 1.7–7.7)
Neutrophils Relative %: 70 %
Platelet Count: 287 10*3/uL (ref 150–400)
RBC: 3.11 MIL/uL — ABNORMAL LOW (ref 3.87–5.11)
RDW: 14 % (ref 11.5–15.5)
WBC Count: 5.6 10*3/uL (ref 4.0–10.5)
nRBC: 0 % (ref 0.0–0.2)

## 2022-09-12 LAB — CMP (CANCER CENTER ONLY)
ALT: 13 U/L (ref 0–44)
AST: 18 U/L (ref 15–41)
Albumin: 3.8 g/dL (ref 3.5–5.0)
Alkaline Phosphatase: 68 U/L (ref 38–126)
Anion gap: 6 (ref 5–15)
BUN: 21 mg/dL (ref 8–23)
CO2: 29 mmol/L (ref 22–32)
Calcium: 9.5 mg/dL (ref 8.9–10.3)
Chloride: 105 mmol/L (ref 98–111)
Creatinine: 1.26 mg/dL — ABNORMAL HIGH (ref 0.44–1.00)
GFR, Estimated: 45 mL/min — ABNORMAL LOW (ref >60)
Glucose, Bld: 94 mg/dL (ref 70–99)
Potassium: 4.2 mmol/L (ref 3.5–5.1)
Sodium: 140 mmol/L (ref 135–145)
Total Bilirubin: 0.3 mg/dL (ref 0.3–1.2)
Total Protein: 6.7 g/dL (ref 6.5–8.1)

## 2022-09-12 LAB — IRON AND IRON BINDING CAPACITY (CC-WL,HP ONLY)
Iron: 90 ug/dL (ref 28–170)
Saturation Ratios: 27 % (ref 10.4–31.8)
TIBC: 332 ug/dL (ref 250–450)
UIBC: 242 ug/dL (ref 148–442)

## 2022-09-12 LAB — FERRITIN: Ferritin: 25 ng/mL (ref 11–307)

## 2022-09-13 LAB — KAPPA/LAMBDA LIGHT CHAINS
Kappa free light chain: 53.7 mg/L — ABNORMAL HIGH (ref 3.3–19.4)
Kappa, lambda light chain ratio: 2.17 — ABNORMAL HIGH (ref 0.26–1.65)
Lambda free light chains: 24.8 mg/L (ref 5.7–26.3)

## 2022-09-17 LAB — MULTIPLE MYELOMA PANEL, SERUM
Albumin SerPl Elph-Mcnc: 3.3 g/dL (ref 2.9–4.4)
Albumin/Glob SerPl: 1.1 (ref 0.7–1.7)
Alpha 1: 0.2 g/dL (ref 0.0–0.4)
Alpha2 Glob SerPl Elph-Mcnc: 0.7 g/dL (ref 0.4–1.0)
B-Globulin SerPl Elph-Mcnc: 1.3 g/dL (ref 0.7–1.3)
Gamma Glob SerPl Elph-Mcnc: 0.8 g/dL (ref 0.4–1.8)
Globulin, Total: 3.1 g/dL (ref 2.2–3.9)
IgA: 261 mg/dL (ref 64–422)
IgG (Immunoglobin G), Serum: 1178 mg/dL (ref 586–1602)
IgM (Immunoglobulin M), Srm: 81 mg/dL (ref 26–217)
Total Protein ELP: 6.4 g/dL (ref 6.0–8.5)

## 2022-09-18 NOTE — Progress Notes (Unsigned)
[place]   Telephone:(336) [phone] Fax:(336) [phone]   Clinic Follow up Note   Patient Care Team: Christa See, FNP as PCP - General (Family Medicine) [name], NP as Nurse Practitioner (Nurse Practitioner) [name], MD as Consulting Physician (Hematology) [name], MD as Consulting Physician (Gastroenterology)  Date of Service:  09/19/2022  CHIEF COMPLAINT: f/u of  anemia, MGUS   CURRENT THERAPY:  Surveillance   ASSESSMENT:  Rebecca Chen is a 76 y.o. female with   Iron deficiency anemia, unspecified -She previously had iron deficient anemia, and resolved after iron supplement in 2020 -She had a recurrent anemia in August 2021 after upper respiratory infection and antibiotics. Endo/colonoscopy by Dr. Paulita Fujita was negative. -She was treated with 4 doses of IV Venofer in 06/2020 and responded well but anemia did not resolve. -07/2020 Labs showed low TIBC which supports anemia of chronic disease. Other lab work was negative for other nutritional anemia. No lab evidence of hemolysis. Her anemia subsequently resolved. -Labs from last week reviewed, she has developed anemia again with hemoglobin 9.6, she is symptomatic with fatigue and mild dyspnea on exertion.  No chest pain.  No overt GI bleeding. -Iron study ferritin was 27, TIBC normal.  Will proceed with IV Venofer 200 mg for total of 4 doses. -Will monitor closely to see if her anemia resolves after IV iron. -She had a negative GI workup in 2021, probably does not need repeated endoscopy at this point.  MGUS (monoclonal gammopathy of unknown significance) -During lab work up for her anemia, 08/11/20 SPEP showed positive M protein at the low level, 0.6g/dl, elevated kappa and lambda light chain level, which is nonspecific.  -She underwent Bone marrow biopsy on 09/01/20. Pathology was overall negative, but did show 5% plasmacytosis. Cytogenetics was normal -Her M protein is now undetectable, and her  light chain level is also spontaneously improving (last 43.9 on 02/06/22). We discussed that this is probably non-specific inflammatory related, I think her risk of multiple myeloma is very low     PLAN: -Lab reviewed hg 9.6 low -Pt agrees to IV Iron 21m Venofer 1-2 times/week  for 4 treatments in the next 2-3 weeks -Repeat lab with her last IV iron infusion -Lab and follow-up in 2 months.   INTERVAL HISTORY:  Rebecca DEBLANCis here for a follow up of anemia, MGUS  She was last seen by me on 02/21/2022 She presents to the clinic alone.Pt reports she had reoccurring systems on Thanksgiving. Migraine headaches,heart palpitations.     All other systems were reviewed with the patient and are negative.  MEDICAL HISTORY:  Past Medical History:  Diagnosis Date   AKI (acute kidney injury) ([place] 01/01/2019   Anemia    Hyperlipidemia    Hypertension    Migraines    Monoclonal gammopathy     SURGICAL HISTORY: Past Surgical History:  Procedure Laterality Date   FOOT SURGERY     IR IVC FILTER PLMT / S&I /IMG GUID/MOD SED  01/01/2019   IR IVC FILTER RETRIEVAL / S&I /IMG GUID/MOD SED  08/09/2019   IR RADIOLOGIST EVAL & MGMT  07/21/2019    I have reviewed the social history and family history with the patient and they are unchanged from previous note.  ALLERGIES:  is allergic to mixed vespid venom and bee venom.  MEDICATIONS:  Current Outpatient Medications  Medication Sig Dispense Refill   Ascorbic Acid (VITAMIN C PO) Take 1 tablet by mouth daily.  atenolol (TENORMIN) 50 MG tablet Take 50 mg by mouth daily.     CALCIUM PO Take 1 tablet by mouth daily.      EPINEPHrine 0.3 mg/0.3 mL IJ SOAJ injection Inject 0.3 mg into the muscle as needed.     FEROSUL 325 (65 Fe) MG tablet TAKE 1 TABLET BY MOUTH ONCE OR TWICE DAILY, WITH VITAMIN-C 60 tablet 1   omeprazole (PRILOSEC) 40 MG capsule Take 40 mg by mouth daily.     simvastatin (ZOCOR) 40 MG tablet Take 40 mg by mouth every evening.       VITAMIN D PO Take 1 tablet by mouth daily.     zolpidem (AMBIEN) 10 MG tablet Take 10 mg by mouth at bedtime as needed. Take for jet lag     No current facility-administered medications for this visit.    PHYSICAL EXAMINATION: ECOG PERFORMANCE STATUS: 1 - Symptomatic but completely ambulatory  Vitals:   09/19/22 1042  BP: (!) 169/87  Pulse: 72  Temp: 97.9 F (36.6 C)  SpO2: 99%   Wt Readings from Last 3 Encounters:  09/19/22 187 lb 3.2 oz (84.9 kg)  09/10/22 189 lb (85.7 kg)  09/10/22 189 lb (85.7 kg)     GENERAL:alert, no distress and comfortable SKIN: skin color normal, no rashes or significant lesions EYES: normal, Conjunctiva are pink and non-injected, sclera clear  NEURO: alert & oriented x 3 with fluent speech  LABORATORY DATA:  I have reviewed the data as listed    Latest Ref Rng & Units 09/12/2022    8:09 AM 02/06/2022    7:43 AM 08/28/2021   12:19 PM  CBC  WBC 4.0 - 10.5 K/uL 5.6  5.5  7.6   Hemoglobin 12.0 - 15.0 g/dL 9.6  12.8  13.8   Hematocrit 36.0 - 46.0 % 30.6  39.3  42.7   Platelets 150 - 400 K/uL 287  244  248         Latest Ref Rng & Units 09/12/2022    8:09 AM 02/06/2022    7:43 AM 08/28/2021   12:19 PM  CMP  Glucose 70 - 99 mg/dL 94  112  104   BUN 8 - 23 mg/dL _0 Creatinine 0.44 - 1.00 mg/dL 1.26  1.19  1.05   Sodium 135 - 145 mmol/L 140  141  139   Potassium 3.5 - 5.1 mmol/L 4.2  4.3  4.5   Chloride 98 - 111 mmol/L 105  106  105   CO2 22 - 32 mmol/L _1 Calcium 8.9 - 10.3 mg/dL 9.5  9.5  9.1   Total Protein 6.5 - 8.1 g/dL 6.7  7.5  7.1   Total Bilirubin 0.3 - 1.2 mg/dL 0.3  0.5  0.4   Alkaline Phos 38 - 126 U/L 68  71  75   AST 15 - 41 U/L _2 ALT 0 - 44 U/L _3 RADIOGRAPHIC STUDIES: I have personally reviewed the radiological images as listed and agreed with the findings in the report. No results found.    No orders of the defined types were placed in this encounter.  All  questions were answered. The patient knows to call the clinic with any problems, questions or concerns. No barriers to learning was detected. The total time spent in the appointment was 20 minutes.  [name], MD 09/19/2022   Felicity Coyer, CMA, am acting as scribe for [name], MD.   I have reviewed the above documentation for accuracy and completeness, and I agree with the above.

## 2022-09-19 ENCOUNTER — Telehealth: Payer: Self-pay | Admitting: Hematology

## 2022-09-19 ENCOUNTER — Encounter: Payer: Self-pay | Admitting: Hematology

## 2022-09-19 ENCOUNTER — Inpatient Hospital Stay (HOSPITAL_BASED_OUTPATIENT_CLINIC_OR_DEPARTMENT_OTHER): Payer: Medicare Other | Admitting: Hematology

## 2022-09-19 VITALS — BP 169/87 | HR 72 | Temp 97.9°F | Ht 68.0 in | Wt 187.2 lb

## 2022-09-19 DIAGNOSIS — D638 Anemia in other chronic diseases classified elsewhere: Secondary | ICD-10-CM | POA: Diagnosis not present

## 2022-09-19 DIAGNOSIS — D500 Iron deficiency anemia secondary to blood loss (chronic): Secondary | ICD-10-CM | POA: Diagnosis not present

## 2022-09-19 DIAGNOSIS — I10 Essential (primary) hypertension: Secondary | ICD-10-CM | POA: Diagnosis not present

## 2022-09-19 DIAGNOSIS — D472 Monoclonal gammopathy: Secondary | ICD-10-CM | POA: Diagnosis not present

## 2022-09-19 DIAGNOSIS — D649 Anemia, unspecified: Secondary | ICD-10-CM | POA: Diagnosis not present

## 2022-09-19 DIAGNOSIS — R0609 Other forms of dyspnea: Secondary | ICD-10-CM | POA: Diagnosis not present

## 2022-09-19 NOTE — Telephone Encounter (Signed)
Left voicemail for patient about upcoming appointment dates.

## 2022-09-19 NOTE — Telephone Encounter (Signed)
Called patient to r/s upcoming appointment due to infusion being overbooked before holiday. Left voicemail with new appointment information. Will f/u and call patient again before EOD.

## 2022-09-19 NOTE — Assessment & Plan Note (Addendum)
-  She previously had iron deficient anemia, and resolved after iron supplement in 2020 -She had a recurrent anemia in August 2021 after upper respiratory infection and antibiotics. Endo/colonoscopy by Dr. Paulita Fujita was negative. -She was treated with 4 doses of IV Venofer in 06/2020 and responded well but anemia did not resolve. -07/2020 Labs showed low TIBC which supports anemia of chronic disease. Other lab work was negative for other nutritional anemia. No lab evidence of hemolysis. Her anemia subsequently resolved. -Labs from last week reviewed, she has developed anemia again with hemoglobin 9.6, she is symptomatic with fatigue and mild dyspnea on exertion.  No chest pain.  No overt GI bleeding. -Iron study ferritin was 27, TIBC normal.  Will proceed with IV Venofer 200 mg for total of 4 doses. -Will monitor closely to see if her anemia resolves after IV iron. -She had a negative GI workup in 2021, probably does not need repeated endoscopy at this point.

## 2022-09-19 NOTE — Assessment & Plan Note (Signed)
-  During lab work up for her anemia, 08/11/20 SPEP showed positive M protein at the low level, 0.6g/dl, elevated kappa and lambda light chain level, which is nonspecific.  -She underwent Bone marrow biopsy on 09/01/20. Pathology was overall negative, but did show 5% plasmacytosis. Cytogenetics was normal -Her M protein is now undetectable, and her light chain level is also spontaneously improving (last 43.9 on 02/06/22). We discussed that this is probably non-specific inflammatory related, I think her risk of multiple myeloma is very low

## 2022-09-20 ENCOUNTER — Inpatient Hospital Stay: Payer: Medicare Other

## 2022-09-24 ENCOUNTER — Telehealth: Payer: Self-pay | Admitting: Hematology

## 2022-09-24 NOTE — Telephone Encounter (Signed)
Patient called to go over schedule. Patient notified.

## 2022-09-25 ENCOUNTER — Inpatient Hospital Stay: Payer: Medicare Other

## 2022-09-25 VITALS — BP 143/79 | HR 77 | Resp 16

## 2022-09-25 DIAGNOSIS — I10 Essential (primary) hypertension: Secondary | ICD-10-CM | POA: Diagnosis not present

## 2022-09-25 DIAGNOSIS — D649 Anemia, unspecified: Secondary | ICD-10-CM | POA: Diagnosis not present

## 2022-09-25 DIAGNOSIS — D472 Monoclonal gammopathy: Secondary | ICD-10-CM | POA: Diagnosis not present

## 2022-09-25 DIAGNOSIS — D509 Iron deficiency anemia, unspecified: Secondary | ICD-10-CM

## 2022-09-25 DIAGNOSIS — R0609 Other forms of dyspnea: Secondary | ICD-10-CM | POA: Diagnosis not present

## 2022-09-25 MED ORDER — LORATADINE 10 MG PO TABS
10.0000 mg | ORAL_TABLET | Freq: Once | ORAL | Status: AC
  Administered 2022-09-25: 10 mg via ORAL
  Filled 2022-09-25: qty 1

## 2022-09-25 MED ORDER — SODIUM CHLORIDE 0.9 % IV SOLN: Freq: Once | INTRAVENOUS | Status: AC

## 2022-09-25 MED ORDER — SODIUM CHLORIDE 0.9 % IV SOLN
200.0000 mg | Freq: Once | INTRAVENOUS | Status: AC
  Administered 2022-09-25: 200 mg via INTRAVENOUS
  Filled 2022-09-25: qty 200

## 2022-09-25 NOTE — Patient Instructions (Signed)

## 2022-09-27 ENCOUNTER — Other Ambulatory Visit: Payer: Self-pay | Admitting: Physician Assistant

## 2022-09-27 ENCOUNTER — Other Ambulatory Visit: Payer: Self-pay | Admitting: Hematology & Oncology

## 2022-09-27 ENCOUNTER — Inpatient Hospital Stay: Payer: Medicare Other

## 2022-09-27 VITALS — BP 161/83 | HR 64 | Temp 98.0°F | Resp 18

## 2022-09-27 DIAGNOSIS — D509 Iron deficiency anemia, unspecified: Secondary | ICD-10-CM

## 2022-09-27 DIAGNOSIS — I10 Essential (primary) hypertension: Secondary | ICD-10-CM | POA: Diagnosis not present

## 2022-09-27 DIAGNOSIS — D649 Anemia, unspecified: Secondary | ICD-10-CM | POA: Diagnosis not present

## 2022-09-27 DIAGNOSIS — D472 Monoclonal gammopathy: Secondary | ICD-10-CM | POA: Diagnosis not present

## 2022-09-27 DIAGNOSIS — R0609 Other forms of dyspnea: Secondary | ICD-10-CM | POA: Diagnosis not present

## 2022-09-27 MED ORDER — SODIUM CHLORIDE 0.9 % IV SOLN
300.0000 mg | Freq: Once | INTRAVENOUS | Status: AC
  Administered 2022-09-27: 300 mg via INTRAVENOUS
  Filled 2022-09-27: qty 300

## 2022-09-27 MED ORDER — SODIUM CHLORIDE 0.9 % IV SOLN: INTRAVENOUS | Status: DC

## 2022-09-27 MED ORDER — LORATADINE 10 MG PO TABS
10.0000 mg | ORAL_TABLET | Freq: Every day | ORAL | Status: AC
  Filled 2022-09-27: qty 1

## 2022-09-27 MED ORDER — LORATADINE 10 MG PO TABS
10.0000 mg | ORAL_TABLET | Freq: Once | ORAL | Status: AC
  Administered 2022-09-27: 10 mg via ORAL
  Filled 2022-09-27: qty 1

## 2022-09-27 NOTE — Patient Instructions (Signed)

## 2022-10-01 ENCOUNTER — Inpatient Hospital Stay: Payer: Medicare Other

## 2022-10-01 ENCOUNTER — Inpatient Hospital Stay: Payer: Medicare Other | Attending: Hematology

## 2022-10-01 VITALS — BP 152/78 | HR 62 | Temp 98.2°F | Resp 18

## 2022-10-01 DIAGNOSIS — D509 Iron deficiency anemia, unspecified: Secondary | ICD-10-CM | POA: Insufficient documentation

## 2022-10-01 DIAGNOSIS — D472 Monoclonal gammopathy: Secondary | ICD-10-CM | POA: Insufficient documentation

## 2022-10-01 MED ORDER — SODIUM CHLORIDE 0.9 % IV SOLN
200.0000 mg | Freq: Once | INTRAVENOUS | Status: AC
  Administered 2022-10-01: 200 mg via INTRAVENOUS
  Filled 2022-10-01: qty 200

## 2022-10-01 NOTE — Progress Notes (Signed)
Patient declined 30 minute post iron infusion. Vss upon discharge.

## 2022-10-03 ENCOUNTER — Inpatient Hospital Stay: Payer: Medicare Other

## 2022-10-03 ENCOUNTER — Other Ambulatory Visit: Payer: Self-pay

## 2022-10-03 VITALS — BP 158/87 | HR 70 | Temp 98.5°F | Resp 18

## 2022-10-03 DIAGNOSIS — D509 Iron deficiency anemia, unspecified: Secondary | ICD-10-CM | POA: Diagnosis not present

## 2022-10-03 DIAGNOSIS — D472 Monoclonal gammopathy: Secondary | ICD-10-CM | POA: Diagnosis not present

## 2022-10-03 LAB — CBC WITH DIFFERENTIAL (CANCER CENTER ONLY)
Abs Immature Granulocytes: 0.02 10*3/uL (ref 0.00–0.07)
Basophils Absolute: 0 10*3/uL (ref 0.0–0.1)
Basophils Relative: 1 %
Eosinophils Absolute: 0.3 10*3/uL (ref 0.0–0.5)
Eosinophils Relative: 4 %
HCT: 37.2 % (ref 36.0–46.0)
Hemoglobin: 11.6 g/dL — ABNORMAL LOW (ref 12.0–15.0)
Immature Granulocytes: 0 %
Lymphocytes Relative: 14 %
Lymphs Abs: 0.9 10*3/uL (ref 0.7–4.0)
MCH: 29.9 pg (ref 26.0–34.0)
MCHC: 31.2 g/dL (ref 30.0–36.0)
MCV: 95.9 fL (ref 80.0–100.0)
Monocytes Absolute: 0.7 10*3/uL (ref 0.1–1.0)
Monocytes Relative: 10 %
Neutro Abs: 4.4 10*3/uL (ref 1.7–7.7)
Neutrophils Relative %: 71 %
Platelet Count: 232 10*3/uL (ref 150–400)
RBC: 3.88 MIL/uL (ref 3.87–5.11)
RDW: 13.6 % (ref 11.5–15.5)
WBC Count: 6.2 10*3/uL (ref 4.0–10.5)
nRBC: 0 % (ref 0.0–0.2)

## 2022-10-03 LAB — FERRITIN: Ferritin: 456 ng/mL — ABNORMAL HIGH (ref 11–307)

## 2022-10-03 LAB — CMP (CANCER CENTER ONLY)
ALT: 18 U/L (ref 0–44)
AST: 22 U/L (ref 15–41)
Albumin: 3.9 g/dL (ref 3.5–5.0)
Alkaline Phosphatase: 79 U/L (ref 38–126)
Anion gap: 6 (ref 5–15)
BUN: 17 mg/dL (ref 8–23)
CO2: 28 mmol/L (ref 22–32)
Calcium: 9.3 mg/dL (ref 8.9–10.3)
Chloride: 105 mmol/L (ref 98–111)
Creatinine: 1.11 mg/dL — ABNORMAL HIGH (ref 0.44–1.00)
GFR, Estimated: 52 mL/min — ABNORMAL LOW (ref >60)
Glucose, Bld: 82 mg/dL (ref 70–99)
Potassium: 4 mmol/L (ref 3.5–5.1)
Sodium: 139 mmol/L (ref 135–145)
Total Bilirubin: 0.3 mg/dL (ref 0.3–1.2)
Total Protein: 6.8 g/dL (ref 6.5–8.1)

## 2022-10-03 LAB — IRON AND IRON BINDING CAPACITY (CC-WL,HP ONLY)
Iron: 593 ug/dL — ABNORMAL HIGH (ref 28–170)
Saturation Ratios: 206 % — ABNORMAL HIGH (ref 10.4–31.8)
TIBC: 288 ug/dL (ref 250–450)
UIBC: UNDETERMINED ug/dL (ref 148–442)

## 2022-10-03 MED ORDER — SODIUM CHLORIDE 0.9 % IV SOLN: INTRAVENOUS | Status: DC

## 2022-10-03 MED ORDER — SODIUM CHLORIDE 0.9 % IV SOLN
200.0000 mg | Freq: Once | INTRAVENOUS | Status: AC
  Administered 2022-10-03: 200 mg via INTRAVENOUS
  Filled 2022-10-03: qty 200

## 2022-10-03 NOTE — Progress Notes (Signed)
Pt declined to stay for post 30 obs, discharged with VSS, ambulatory to lobby. 

## 2022-10-06 ENCOUNTER — Encounter: Payer: Self-pay | Admitting: Hematology

## 2022-10-25 DIAGNOSIS — H2511 Age-related nuclear cataract, right eye: Secondary | ICD-10-CM | POA: Diagnosis not present

## 2022-10-25 DIAGNOSIS — H25013 Cortical age-related cataract, bilateral: Secondary | ICD-10-CM | POA: Diagnosis not present

## 2022-10-25 DIAGNOSIS — H2513 Age-related nuclear cataract, bilateral: Secondary | ICD-10-CM | POA: Diagnosis not present

## 2022-10-25 DIAGNOSIS — H18413 Arcus senilis, bilateral: Secondary | ICD-10-CM | POA: Diagnosis not present

## 2022-10-25 DIAGNOSIS — H25043 Posterior subcapsular polar age-related cataract, bilateral: Secondary | ICD-10-CM | POA: Diagnosis not present

## 2022-10-28 ENCOUNTER — Encounter: Payer: Self-pay | Admitting: Hematology

## 2022-11-20 ENCOUNTER — Inpatient Hospital Stay: Payer: Medicare Other

## 2022-11-20 ENCOUNTER — Inpatient Hospital Stay: Payer: Medicare Other | Admitting: Hematology

## 2022-12-15 NOTE — Assessment & Plan Note (Signed)
-  During lab work up for her anemia, 08/11/20 SPEP showed positive M protein at the low level, 0.6g/dl, elevated kappa and lambda light chain level, which is nonspecific.  -She underwent Bone marrow biopsy on 09/01/20. Pathology was overall negative, but did show 5% plasmacytosis. Cytogenetics was normal -Her M protein is now undetectable, and her light chain level is also spontaneously improving (last 43.9 on 02/06/22). We discussed that this is probably non-specific inflammatory related, I think her risk of multiple myeloma is very low -continue monitoring

## 2022-12-15 NOTE — Assessment & Plan Note (Signed)
She previously had iron deficient anemia, and resolved after iron supplement in 2020 -She had a recurrent anemia in August 2021 after upper respiratory infection and antibiotics. Endo/colonoscopy by Dr. Paulita Fujita was negative. -She was treated with 4 doses of IV Venofer in 06/2020 and responded well but anemia did not resolve. -07/2020 Labs showed low TIBC which supports anemia of chronic disease. Other lab work was negative for other nutritional anemia. No lab evidence of hemolysis. Her anemia subsequently resolved. -continue iv iron as needed. She received last iron infusion in 09/2022 for anemia.

## 2022-12-16 ENCOUNTER — Encounter: Payer: Self-pay | Admitting: Hematology

## 2022-12-16 ENCOUNTER — Inpatient Hospital Stay: Payer: Medicare Other | Attending: Hematology

## 2022-12-16 ENCOUNTER — Inpatient Hospital Stay (HOSPITAL_BASED_OUTPATIENT_CLINIC_OR_DEPARTMENT_OTHER): Payer: Medicare Other | Admitting: Hematology

## 2022-12-16 VITALS — BP 156/87 | HR 79 | Temp 97.5°F | Resp 18 | Ht 68.0 in | Wt 193.8 lb

## 2022-12-16 DIAGNOSIS — D500 Iron deficiency anemia secondary to blood loss (chronic): Secondary | ICD-10-CM | POA: Diagnosis not present

## 2022-12-16 DIAGNOSIS — D509 Iron deficiency anemia, unspecified: Secondary | ICD-10-CM | POA: Diagnosis not present

## 2022-12-16 DIAGNOSIS — I10 Essential (primary) hypertension: Secondary | ICD-10-CM | POA: Diagnosis not present

## 2022-12-16 DIAGNOSIS — D472 Monoclonal gammopathy: Secondary | ICD-10-CM | POA: Insufficient documentation

## 2022-12-16 DIAGNOSIS — D638 Anemia in other chronic diseases classified elsewhere: Secondary | ICD-10-CM | POA: Insufficient documentation

## 2022-12-16 LAB — CBC WITH DIFFERENTIAL (CANCER CENTER ONLY)
Abs Immature Granulocytes: 0.01 10*3/uL (ref 0.00–0.07)
Basophils Absolute: 0.1 10*3/uL (ref 0.0–0.1)
Basophils Relative: 1 %
Eosinophils Absolute: 0.2 10*3/uL (ref 0.0–0.5)
Eosinophils Relative: 3 %
HCT: 43.3 % (ref 36.0–46.0)
Hemoglobin: 14 g/dL (ref 12.0–15.0)
Immature Granulocytes: 0 %
Lymphocytes Relative: 21 %
Lymphs Abs: 1.1 10*3/uL (ref 0.7–4.0)
MCH: 30.1 pg (ref 26.0–34.0)
MCHC: 32.3 g/dL (ref 30.0–36.0)
MCV: 93.1 fL (ref 80.0–100.0)
Monocytes Absolute: 0.5 10*3/uL (ref 0.1–1.0)
Monocytes Relative: 9 %
Neutro Abs: 3.5 10*3/uL (ref 1.7–7.7)
Neutrophils Relative %: 66 %
Platelet Count: 200 10*3/uL (ref 150–400)
RBC: 4.65 MIL/uL (ref 3.87–5.11)
RDW: 14 % (ref 11.5–15.5)
WBC Count: 5.3 10*3/uL (ref 4.0–10.5)
nRBC: 0 % (ref 0.0–0.2)

## 2022-12-16 LAB — FERRITIN: Ferritin: 107 ng/mL (ref 11–307)

## 2022-12-16 LAB — CMP (CANCER CENTER ONLY)
ALT: 24 U/L (ref 0–44)
AST: 23 U/L (ref 15–41)
Albumin: 3.9 g/dL (ref 3.5–5.0)
Alkaline Phosphatase: 74 U/L (ref 38–126)
Anion gap: 8 (ref 5–15)
BUN: 23 mg/dL (ref 8–23)
CO2: 26 mmol/L (ref 22–32)
Calcium: 9.1 mg/dL (ref 8.9–10.3)
Chloride: 106 mmol/L (ref 98–111)
Creatinine: 1.11 mg/dL — ABNORMAL HIGH (ref 0.44–1.00)
GFR, Estimated: 52 mL/min — ABNORMAL LOW (ref >60)
Glucose, Bld: 120 mg/dL — ABNORMAL HIGH (ref 70–99)
Potassium: 4.3 mmol/L (ref 3.5–5.1)
Sodium: 140 mmol/L (ref 135–145)
Total Bilirubin: 0.5 mg/dL (ref 0.3–1.2)
Total Protein: 7.3 g/dL (ref 6.5–8.1)

## 2022-12-16 LAB — IRON AND IRON BINDING CAPACITY (CC-WL,HP ONLY)
Iron: 100 ug/dL (ref 28–170)
Saturation Ratios: 34 % — ABNORMAL HIGH (ref 10.4–31.8)
TIBC: 295 ug/dL (ref 250–450)
UIBC: 195 ug/dL (ref 148–442)

## 2022-12-16 NOTE — Progress Notes (Signed)
[place]   Telephone:(336) [phone] Fax:(336) [phone]   Clinic Follow up Note   Patient Care Team: Christa See, FNP as PCP - General (Family Medicine) [name], NP as Nurse Practitioner (Nurse Practitioner) [name], MD as Consulting Physician (Hematology) [name], MD as Consulting Physician (Gastroenterology)  Date of Service:  12/16/2022  CHIEF COMPLAINT: f/u of anemia, MGUS   CURRENT THERAPY: Surveillance    ASSESSMENT:  Rebecca Chen is a 77 y.o. female with   Iron deficiency anemia, unspecified  She previously had iron deficient anemia, and resolved after iron supplement in 2020 -She had a recurrent anemia in August 2021 after upper respiratory infection and antibiotics. Endo/colonoscopy by Dr. Paulita Fujita was negative. -She was treated with 4 doses of IV Venofer in 06/2020 and responded well but anemia did not resolve. -07/2020 Labs showed low TIBC which supports anemia of chronic disease. Other lab work was negative for other nutritional anemia. No lab evidence of hemolysis. Her anemia subsequently resolved. -continue iv iron as needed. She received last iron infusion in 09/2022 for anemia.   MGUS (monoclonal gammopathy of unknown significance) -During lab work up for her anemia, 08/11/20 SPEP showed positive M protein at the low level, 0.6g/dl, elevated kappa and lambda light chain level, which is nonspecific.  -She underwent Bone marrow biopsy on 09/01/20. Pathology was overall negative, but did show 5% plasmacytosis. Cytogenetics was normal -Her M protein is now undetectable, and her light chain level is also spontaneously improving (last 43.9 on 02/06/22). We discussed that this is probably non-specific inflammatory related, I think her risk of multiple myeloma is very low -continue monitoring     PLAN: -lab reviewed hg 14, anemia resolved after iv iron, iron level still pending  -Pt agrees to repeat of CBC and Iron level q3  months -Monitor M -protein q6 months -Discuss light chain and Previous SPEP test from 08/2022    INTERVAL HISTORY:  Rebecca Chen is here for a follow up of  anemia, MGUS She was last seen by me on 09/19/2022 She presents to the clinic alone. Pt state her energy level is good. Pt over all is good. Pt stated what alerted her to come in and that her Iron level was low, she had a serious of migraines and she had the same symptoms before.     All other systems were reviewed with the patient and are negative.  MEDICAL HISTORY:  Past Medical History:  Diagnosis Date   AKI (acute kidney injury) ([place]) 01/01/2019   Anemia    Hyperlipidemia    Hypertension    Migraines    Monoclonal gammopathy     SURGICAL HISTORY: Past Surgical History:  Procedure Laterality Date   FOOT SURGERY     IR IVC FILTER PLMT / S&I /IMG GUID/MOD SED  01/01/2019   IR IVC FILTER RETRIEVAL / S&I /IMG GUID/MOD SED  08/09/2019   IR RADIOLOGIST EVAL & MGMT  07/21/2019    I have reviewed the social history and family history with the patient and they are unchanged from previous note.  ALLERGIES:  is allergic to mixed vespid venom and bee venom.  MEDICATIONS:  Current Outpatient Medications  Medication Sig Dispense Refill   Ascorbic Acid (VITAMIN C PO) Take 1 tablet by mouth daily.      atenolol (TENORMIN) 50 MG tablet Take 50 mg by mouth daily.     CALCIUM PO Take 1 tablet by mouth daily.      EPINEPHrine  0.3 mg/0.3 mL IJ SOAJ injection Inject 0.3 mg into the muscle as needed.     FEROSUL 325 (65 Fe) MG tablet TAKE 1 TABLET BY MOUTH ONCE OR TWICE DAILY, WITH VITAMIN-C 60 tablet 1   omeprazole (PRILOSEC) 40 MG capsule Take 40 mg by mouth daily.     simvastatin (ZOCOR) 40 MG tablet Take 40 mg by mouth every evening.      VITAMIN D PO Take 1 tablet by mouth daily.     zolpidem (AMBIEN) 10 MG tablet Take 10 mg by mouth at bedtime as needed. Take for jet lag     No current facility-administered medications for  this visit.   Facility-Administered Medications Ordered in Other Visits  Medication Dose Route Frequency Provider Last Rate Last Admin   loratadine (CLARITIN) tablet 10 mg  10 mg Oral Daily Ennever, Rudell Cobb, MD        PHYSICAL EXAMINATION: ECOG PERFORMANCE STATUS: 0 - Asymptomatic  Vitals:   12/16/22 0820  BP: (!) 156/87  Pulse: 79  Resp: 18  Temp: (!) 97.5 F (36.4 C)  SpO2: 97%   Wt Readings from Last 3 Encounters:  12/16/22 193 lb 12.8 oz (87.9 kg)  09/19/22 187 lb 3.2 oz (84.9 kg)  09/10/22 189 lb (85.7 kg)     GENERAL:alert, no distress and comfortable SKIN: skin color normal, no rashes or significant lesions EYES: normal, Conjunctiva are pink and non-injected, sclera clear  NEURO: alert & oriented x 3 with fluent speech  LABORATORY DATA:  I have reviewed the data as listed    Latest Ref Rng & Units 12/16/2022    7:57 AM 10/03/2022    8:32 AM 09/12/2022    8:09 AM  CBC  WBC 4.0 - 10.5 K/uL 5.3  6.2  5.6   Hemoglobin 12.0 - 15.0 g/dL 14.0  11.6  9.6   Hematocrit 36.0 - 46.0 % 43.3  37.2  30.6   Platelets 150 - 400 K/uL 200  232  287         Latest Ref Rng & Units 12/16/2022    7:57 AM 10/03/2022    8:32 AM 09/12/2022    8:09 AM  CMP  Glucose 70 - 99 mg/dL 120  82  94   BUN 8 - 23 mg/dL 23  17  21    Creatinine 0.44 - 1.00 mg/dL 1.11  1.11  1.26   Sodium 135 - 145 mmol/L 140  139  140   Potassium 3.5 - 5.1 mmol/L 4.3  4.0  4.2   Chloride 98 - 111 mmol/L 106  105  105   CO2 22 - 32 mmol/L 26  28  29    Calcium 8.9 - 10.3 mg/dL 9.1  9.3  9.5   Total Protein 6.5 - 8.1 g/dL 7.3  6.8  6.7   Total Bilirubin 0.3 - 1.2 mg/dL 0.5  0.3  0.3   Alkaline Phos 38 - 126 U/L 74  79  68   AST 15 - 41 U/L 23  22  18    ALT 0 - 44 U/L 24  18  13        RADIOGRAPHIC STUDIES: I have personally reviewed the radiological images as listed and agreed with the findings in the report. No results found.    No orders of the defined types were placed in this encounter.  All  questions were answered. The patient knows to call the clinic with any problems, questions or concerns. No barriers to learning was  detected. The total time spent in the appointment was 20 minutes.     [name], MD 12/16/2022   Felicity Coyer, CMA, am acting as scribe for [name], MD.   I have reviewed the above documentation for accuracy and completeness, and I agree with the above.

## 2023-01-20 DIAGNOSIS — H2511 Age-related nuclear cataract, right eye: Secondary | ICD-10-CM | POA: Diagnosis not present

## 2023-02-19 ENCOUNTER — Other Ambulatory Visit: Payer: Medicare Other

## 2023-02-19 ENCOUNTER — Ambulatory Visit: Payer: Medicare Other | Admitting: Hematology

## 2023-02-26 ENCOUNTER — Ambulatory Visit: Payer: Medicare Other | Admitting: Hematology

## 2023-03-17 ENCOUNTER — Inpatient Hospital Stay: Payer: Medicare Other | Attending: Hematology

## 2023-03-17 ENCOUNTER — Other Ambulatory Visit: Payer: Self-pay

## 2023-03-17 DIAGNOSIS — D472 Monoclonal gammopathy: Secondary | ICD-10-CM | POA: Diagnosis not present

## 2023-03-17 DIAGNOSIS — D509 Iron deficiency anemia, unspecified: Secondary | ICD-10-CM | POA: Insufficient documentation

## 2023-03-17 LAB — CMP (CANCER CENTER ONLY)
ALT: 23 U/L (ref 0–44)
AST: 22 U/L (ref 15–41)
Albumin: 3.7 g/dL (ref 3.5–5.0)
Alkaline Phosphatase: 69 U/L (ref 38–126)
Anion gap: 7 (ref 5–15)
BUN: 21 mg/dL (ref 8–23)
CO2: 26 mmol/L (ref 22–32)
Calcium: 9.1 mg/dL (ref 8.9–10.3)
Chloride: 108 mmol/L (ref 98–111)
Creatinine: 0.97 mg/dL (ref 0.44–1.00)
GFR, Estimated: >60 mL/min (ref >60)
Glucose, Bld: 92 mg/dL (ref 70–99)
Potassium: 4.2 mmol/L (ref 3.5–5.1)
Sodium: 141 mmol/L (ref 135–145)
Total Bilirubin: 0.7 mg/dL (ref 0.3–1.2)
Total Protein: 6.8 g/dL (ref 6.5–8.1)

## 2023-03-17 LAB — CBC WITH DIFFERENTIAL (CANCER CENTER ONLY)
Abs Immature Granulocytes: 0.01 10*3/uL (ref 0.00–0.07)
Basophils Absolute: 0 10*3/uL (ref 0.0–0.1)
Basophils Relative: 1 %
Eosinophils Absolute: 0.2 10*3/uL (ref 0.0–0.5)
Eosinophils Relative: 3 %
HCT: 41.9 % (ref 36.0–46.0)
Hemoglobin: 13.7 g/dL (ref 12.0–15.0)
Immature Granulocytes: 0 %
Lymphocytes Relative: 24 %
Lymphs Abs: 1.2 10*3/uL (ref 0.7–4.0)
MCH: 31 pg (ref 26.0–34.0)
MCHC: 32.7 g/dL (ref 30.0–36.0)
MCV: 94.8 fL (ref 80.0–100.0)
Monocytes Absolute: 0.5 10*3/uL (ref 0.1–1.0)
Monocytes Relative: 10 %
Neutro Abs: 3.1 10*3/uL (ref 1.7–7.7)
Neutrophils Relative %: 62 %
Platelet Count: 195 10*3/uL (ref 150–400)
RBC: 4.42 MIL/uL (ref 3.87–5.11)
RDW: 14.2 % (ref 11.5–15.5)
WBC Count: 5 10*3/uL (ref 4.0–10.5)
nRBC: 0 % (ref 0.0–0.2)

## 2023-03-17 LAB — IRON AND IRON BINDING CAPACITY (CC-WL,HP ONLY)
Iron: 130 ug/dL (ref 28–170)
Saturation Ratios: 46 % — ABNORMAL HIGH (ref 10.4–31.8)
TIBC: 281 ug/dL (ref 250–450)
UIBC: 151 ug/dL (ref 148–442)

## 2023-03-17 LAB — FERRITIN: Ferritin: 193 ng/mL (ref 11–307)

## 2023-03-18 ENCOUNTER — Encounter: Payer: Self-pay | Admitting: Hematology

## 2023-03-18 LAB — KAPPA/LAMBDA LIGHT CHAINS
Kappa free light chain: 50.9 mg/L — ABNORMAL HIGH (ref 3.3–19.4)
Kappa, lambda light chain ratio: 2.25 — ABNORMAL HIGH (ref 0.26–1.65)
Lambda free light chains: 22.6 mg/L (ref 5.7–26.3)

## 2023-03-21 LAB — MULTIPLE MYELOMA PANEL, SERUM
Albumin SerPl Elph-Mcnc: 3.4 g/dL (ref 2.9–4.4)
Albumin/Glob SerPl: 1.2 (ref 0.7–1.7)
Alpha 1: 0.2 g/dL (ref 0.0–0.4)
Alpha2 Glob SerPl Elph-Mcnc: 0.7 g/dL (ref 0.4–1.0)
B-Globulin SerPl Elph-Mcnc: 1.5 g/dL — ABNORMAL HIGH (ref 0.7–1.3)
Gamma Glob SerPl Elph-Mcnc: 0.7 g/dL (ref 0.4–1.8)
Globulin, Total: 3 g/dL (ref 2.2–3.9)
IgA: 252 mg/dL (ref 64–422)
IgG (Immunoglobin G), Serum: 1310 mg/dL (ref 586–1602)
IgM (Immunoglobulin M), Srm: 94 mg/dL (ref 26–217)
M Protein SerPl Elph-Mcnc: 0.3 g/dL — ABNORMAL HIGH
Total Protein ELP: 6.4 g/dL (ref 6.0–8.5)

## 2023-03-30 ENCOUNTER — Encounter: Payer: Self-pay | Admitting: Hematology

## 2023-05-14 NOTE — Telephone Encounter (Signed)
done

## 2023-05-20 DIAGNOSIS — Z23 Encounter for immunization: Secondary | ICD-10-CM | POA: Diagnosis not present

## 2023-05-29 DIAGNOSIS — R42 Dizziness and giddiness: Secondary | ICD-10-CM | POA: Diagnosis not present

## 2023-05-29 DIAGNOSIS — Z6831 Body mass index (BMI) 31.0-31.9, adult: Secondary | ICD-10-CM | POA: Diagnosis not present

## 2023-05-29 DIAGNOSIS — I10 Essential (primary) hypertension: Secondary | ICD-10-CM | POA: Diagnosis not present

## 2023-06-01 DIAGNOSIS — Z23 Encounter for immunization: Secondary | ICD-10-CM | POA: Diagnosis not present

## 2023-06-09 ENCOUNTER — Inpatient Hospital Stay: Payer: Medicare Other | Attending: Hematology

## 2023-06-09 ENCOUNTER — Other Ambulatory Visit: Payer: Self-pay

## 2023-06-09 DIAGNOSIS — D509 Iron deficiency anemia, unspecified: Secondary | ICD-10-CM | POA: Insufficient documentation

## 2023-06-09 DIAGNOSIS — D472 Monoclonal gammopathy: Secondary | ICD-10-CM | POA: Diagnosis not present

## 2023-06-09 LAB — CBC WITH DIFFERENTIAL (CANCER CENTER ONLY)
Abs Immature Granulocytes: 0.02 10*3/uL (ref 0.00–0.07)
Basophils Absolute: 0 10*3/uL (ref 0.0–0.1)
Basophils Relative: 1 %
Eosinophils Absolute: 0.2 10*3/uL (ref 0.0–0.5)
Eosinophils Relative: 3 %
HCT: 41.9 % (ref 36.0–46.0)
Hemoglobin: 13.8 g/dL (ref 12.0–15.0)
Immature Granulocytes: 0 %
Lymphocytes Relative: 21 %
Lymphs Abs: 1.2 10*3/uL (ref 0.7–4.0)
MCH: 31.7 pg (ref 26.0–34.0)
MCHC: 32.9 g/dL (ref 30.0–36.0)
MCV: 96.1 fL (ref 80.0–100.0)
Monocytes Absolute: 0.5 10*3/uL (ref 0.1–1.0)
Monocytes Relative: 8 %
Neutro Abs: 4 10*3/uL (ref 1.7–7.7)
Neutrophils Relative %: 67 %
Platelet Count: 215 10*3/uL (ref 150–400)
RBC: 4.36 MIL/uL (ref 3.87–5.11)
RDW: 12.6 % (ref 11.5–15.5)
WBC Count: 5.9 10*3/uL (ref 4.0–10.5)
nRBC: 0 % (ref 0.0–0.2)

## 2023-06-09 LAB — CMP (CANCER CENTER ONLY)
ALT: 25 U/L (ref 0–44)
AST: 22 U/L (ref 15–41)
Albumin: 3.8 g/dL (ref 3.5–5.0)
Alkaline Phosphatase: 71 U/L (ref 38–126)
Anion gap: 6 (ref 5–15)
BUN: 21 mg/dL (ref 8–23)
CO2: 29 mmol/L (ref 22–32)
Calcium: 9 mg/dL (ref 8.9–10.3)
Chloride: 105 mmol/L (ref 98–111)
Creatinine: 1.11 mg/dL — ABNORMAL HIGH (ref 0.44–1.00)
GFR, Estimated: 52 mL/min — ABNORMAL LOW (ref >60)
Glucose, Bld: 147 mg/dL — ABNORMAL HIGH (ref 70–99)
Potassium: 4 mmol/L (ref 3.5–5.1)
Sodium: 140 mmol/L (ref 135–145)
Total Bilirubin: 0.7 mg/dL (ref 0.3–1.2)
Total Protein: 7.2 g/dL (ref 6.5–8.1)

## 2023-06-09 LAB — IRON AND IRON BINDING CAPACITY (CC-WL,HP ONLY)
Iron: 130 ug/dL (ref 28–170)
Saturation Ratios: 45 % — ABNORMAL HIGH (ref 10.4–31.8)
TIBC: 287 ug/dL (ref 250–450)
UIBC: 157 ug/dL (ref 148–442)

## 2023-06-09 LAB — FERRITIN: Ferritin: 247 ng/mL (ref 11–307)

## 2023-06-15 NOTE — Assessment & Plan Note (Signed)
She previously had iron deficient anemia, and resolved after iron supplement in 2020 -She had a recurrent anemia in August 2021 after upper respiratory infection and antibiotics. Endo/colonoscopy by Dr. Dulce Sellar was negative. -She was treated with 4 doses of IV Venofer in 06/2020 and responded well but anemia did not resolve. -07/2020 Labs showed low TIBC which supports anemia of chronic disease. Other lab work was negative for other nutritional anemia. No lab evidence of hemolysis. Her anemia subsequently resolved. -continue iv iron as needed. She received last iron infusion in 09/2022 for anemia.

## 2023-06-16 ENCOUNTER — Encounter: Payer: Self-pay | Admitting: Hematology

## 2023-06-16 ENCOUNTER — Inpatient Hospital Stay (HOSPITAL_BASED_OUTPATIENT_CLINIC_OR_DEPARTMENT_OTHER): Payer: Medicare Other | Admitting: Hematology

## 2023-06-16 VITALS — BP 141/79 | HR 70 | Temp 98.3°F | Resp 18 | Ht 68.0 in | Wt 190.6 lb

## 2023-06-16 DIAGNOSIS — D500 Iron deficiency anemia secondary to blood loss (chronic): Secondary | ICD-10-CM

## 2023-06-16 DIAGNOSIS — D472 Monoclonal gammopathy: Secondary | ICD-10-CM | POA: Diagnosis not present

## 2023-06-16 DIAGNOSIS — D509 Iron deficiency anemia, unspecified: Secondary | ICD-10-CM | POA: Diagnosis not present

## 2023-06-16 NOTE — Progress Notes (Signed)
[facility]   Telephone:(336) [phone] Fax:(336) [phone]   Clinic Follow up Note   Patient Care Team: Ayesha Rumpf, FNP as PCP - General (Family Medicine) Pollyann Samples, NP as Nurse Practitioner (Nurse Practitioner) Malachy Mood, MD as Consulting Physician (Hematology) Willis Modena, MD as Consulting Physician (Gastroenterology)  Date of Service:  06/16/2023  CHIEF COMPLAINT: f/u of anemia, MGUS   CURRENT THERAPY:  Surveillance  ASSESSMENT:  Rebecca Chen is a 77 y.o. female with   Iron deficiency anemia, unspecified  She previously had iron deficient anemia, and resolved after iron supplement in 2020 -She had a recurrent anemia in August 2021 after upper respiratory infection and antibiotics. Endo/colonoscopy by Dr. Dulce Sellar was negative. -She was treated with 4 doses of IV Venofer in 06/2020 and responded well but anemia did not resolve. -07/2020 Labs showed low TIBC which supports anemia of chronic disease. Other lab work was negative for other nutritional anemia. No lab evidence of hemolysis. Her anemia subsequently resolved. -continue iv iron as needed. She received last iron infusion in 09/2022 for anemia.  -she is clinically doing well, lab work from June 09, 2023 showed normal CBC and ferritin  MGUS (monoclonal gammopathy of unknown significance) -During lab work up for her anemia, 08/11/20 SPEP showed positive M protein at the low level, 0.6g/dl, elevated kappa and lambda light chain level, which is nonspecific.  -She underwent Bone marrow biopsy on 09/01/20. Pathology was overall negative, but did show 5% plasmacytosis. Cytogenetics was normal -Her M protein has been 0-0.3, kappa light chain level has been overall stable, no concerns.   PLAN: -lab reviewed -CMP -pending - Continue q6 months of monitoring -Mammogram is schedule for 08/2023 -lab and f/u in 1 year   INTERVAL HISTORY:  Rebecca Chen is here for a follow up of 3anemia, MGUS .She  was last seen by me on 12/16/2022. She presents to the clinic alone. Pt state that she has increase her atenolol from 50 mg to 100 mg daily. She state that her energy level is up.She is overall clinically doing well.      All other systems were reviewed with the patient and are negative.  MEDICAL HISTORY:  Past Medical History:  Diagnosis Date   AKI (acute kidney injury) (HCC) 01/01/2019   Anemia    Hyperlipidemia    Hypertension    Migraines    Monoclonal gammopathy     SURGICAL HISTORY: Past Surgical History:  Procedure Laterality Date   FOOT SURGERY     IR IVC FILTER PLMT / S&I /IMG GUID/MOD SED  01/01/2019   IR IVC FILTER RETRIEVAL / S&I /IMG GUID/MOD SED  08/09/2019   IR RADIOLOGIST EVAL & MGMT  07/21/2019    I have reviewed the social history and family history with the patient and they are unchanged from previous note.  ALLERGIES:  is allergic to mixed vespid venom and bee venom.  MEDICATIONS:  Current Outpatient Medications  Medication Sig Dispense Refill   Ascorbic Acid (VITAMIN C PO) Take 1 tablet by mouth daily.      atenolol (TENORMIN) 100 MG tablet Take 100 mg by mouth daily.     CALCIUM PO Take 1 tablet by mouth daily.      EPINEPHrine 0.3 mg/0.3 mL IJ SOAJ injection Inject 0.3 mg into the muscle as needed.     FEROSUL 325 (65 Fe) MG tablet TAKE 1 TABLET BY MOUTH ONCE OR TWICE DAILY, WITH VITAMIN-C 60 tablet 1   omeprazole (PRILOSEC)  40 MG capsule Take 40 mg by mouth daily.     simvastatin (ZOCOR) 40 MG tablet Take 40 mg by mouth every evening.      VITAMIN D PO Take 1 tablet by mouth daily.     zolpidem (AMBIEN) 10 MG tablet Take 10 mg by mouth at bedtime as needed. Take for jet lag     No current facility-administered medications for this visit.   Facility-Administered Medications Ordered in Other Visits  Medication Dose Route Frequency Provider Last Rate Last Admin   loratadine (CLARITIN) tablet 10 mg  10 mg Oral Daily Ennever, Rose Phi, MD         PHYSICAL EXAMINATION: ECOG PERFORMANCE STATUS: 0 - Asymptomatic  Vitals:   06/16/23 1143  BP: (!) 141/79  Pulse: 70  Resp: 18  Temp: 98.3 F (36.8 C)  SpO2: 95%   Wt Readings from Last 3 Encounters:  06/16/23 190 lb 9.6 oz (86.5 kg)  12/16/22 193 lb 12.8 oz (87.9 kg)  09/19/22 187 lb 3.2 oz (84.9 kg)      GENERAL:alert, no distress and comfortable SKIN: skin color normal, no rashes or significant lesions EYES: normal, Conjunctiva are pink and non-injected, sclera clear  NEURO: alert & oriented x 3 with fluent speech LABORATORY DATA:  I have reviewed the data as listed    Latest Ref Rng & Units 06/09/2023    8:03 AM 03/17/2023    8:12 AM 12/16/2022    7:57 AM  CBC  WBC 4.0 - 10.5 K/uL 5.9  5.0  5.3   Hemoglobin 12.0 - 15.0 g/dL 85.2  77.8  24.2   Hematocrit 36.0 - 46.0 % 41.9  41.9  43.3   Platelets 150 - 400 K/uL 215  195  200         Latest Ref Rng & Units 06/09/2023    8:03 AM 03/17/2023    8:12 AM 12/16/2022    7:57 AM  CMP  Glucose 70 - 99 mg/dL 353  92  614   BUN 8 - 23 mg/dL 21  21  23    Creatinine 0.44 - 1.00 mg/dL 4.31  5.40  0.86   Sodium 135 - 145 mmol/L 140  141  140   Potassium 3.5 - 5.1 mmol/L 4.0  4.2  4.3   Chloride 98 - 111 mmol/L 105  108  106   CO2 22 - 32 mmol/L 29  26  26    Calcium 8.9 - 10.3 mg/dL 9.0  9.1  9.1   Total Protein 6.5 - 8.1 g/dL 7.2  6.8  7.3   Total Bilirubin 0.3 - 1.2 mg/dL 0.7  0.7  0.5   Alkaline Phos 38 - 126 U/L 71  69  74   AST 15 - 41 U/L 22  22  23    ALT 0 - 44 U/L 25  23  24         RADIOGRAPHIC STUDIES: I have personally reviewed the radiological images as listed and agreed with the findings in the report. No results found.    No orders of the defined types were placed in this encounter.  All questions were answered. The patient knows to call the clinic with any problems, questions or concerns. No barriers to learning was detected. The total time spent in the appointment was 15 minutes.     Malachy Mood,  MD 06/16/2023   Carolin Coy, CMA, am acting as scribe for Malachy Mood, MD.   I have reviewed the above  documentation for accuracy and completeness, and I agree with the above.

## 2023-06-30 DIAGNOSIS — D225 Melanocytic nevi of trunk: Secondary | ICD-10-CM | POA: Diagnosis not present

## 2023-06-30 DIAGNOSIS — L821 Other seborrheic keratosis: Secondary | ICD-10-CM | POA: Diagnosis not present

## 2023-06-30 DIAGNOSIS — L570 Actinic keratosis: Secondary | ICD-10-CM | POA: Diagnosis not present

## 2023-06-30 DIAGNOSIS — L814 Other melanin hyperpigmentation: Secondary | ICD-10-CM | POA: Diagnosis not present

## 2023-06-30 DIAGNOSIS — Z85828 Personal history of other malignant neoplasm of skin: Secondary | ICD-10-CM | POA: Diagnosis not present

## 2023-06-30 DIAGNOSIS — L578 Other skin changes due to chronic exposure to nonionizing radiation: Secondary | ICD-10-CM | POA: Diagnosis not present

## 2023-07-04 DIAGNOSIS — H43393 Other vitreous opacities, bilateral: Secondary | ICD-10-CM | POA: Diagnosis not present

## 2023-07-28 ENCOUNTER — Other Ambulatory Visit: Payer: Self-pay | Admitting: Family Medicine

## 2023-07-28 DIAGNOSIS — Z1231 Encounter for screening mammogram for malignant neoplasm of breast: Secondary | ICD-10-CM

## 2023-07-31 DIAGNOSIS — Z0000 Encounter for general adult medical examination without abnormal findings: Secondary | ICD-10-CM | POA: Diagnosis not present

## 2023-07-31 DIAGNOSIS — Z8639 Personal history of other endocrine, nutritional and metabolic disease: Secondary | ICD-10-CM | POA: Diagnosis not present

## 2023-07-31 DIAGNOSIS — Z6830 Body mass index (BMI) 30.0-30.9, adult: Secondary | ICD-10-CM | POA: Diagnosis not present

## 2023-07-31 DIAGNOSIS — R42 Dizziness and giddiness: Secondary | ICD-10-CM | POA: Diagnosis not present

## 2023-07-31 DIAGNOSIS — I10 Essential (primary) hypertension: Secondary | ICD-10-CM | POA: Diagnosis not present

## 2023-07-31 DIAGNOSIS — E559 Vitamin D deficiency, unspecified: Secondary | ICD-10-CM | POA: Diagnosis not present

## 2023-07-31 DIAGNOSIS — G4709 Other insomnia: Secondary | ICD-10-CM | POA: Diagnosis not present

## 2023-07-31 DIAGNOSIS — E785 Hyperlipidemia, unspecified: Secondary | ICD-10-CM | POA: Diagnosis not present

## 2023-07-31 DIAGNOSIS — R051 Acute cough: Secondary | ICD-10-CM | POA: Diagnosis not present

## 2023-07-31 DIAGNOSIS — I700 Atherosclerosis of aorta: Secondary | ICD-10-CM | POA: Diagnosis not present

## 2023-07-31 DIAGNOSIS — G43909 Migraine, unspecified, not intractable, without status migrainosus: Secondary | ICD-10-CM | POA: Diagnosis not present

## 2023-07-31 DIAGNOSIS — D509 Iron deficiency anemia, unspecified: Secondary | ICD-10-CM | POA: Diagnosis not present

## 2023-09-02 ENCOUNTER — Ambulatory Visit
Admission: RE | Admit: 2023-09-02 | Discharge: 2023-09-02 | Disposition: A | Discharge status: Home / Self Care | Payer: Medicare Other | Source: Ambulatory Visit | Attending: Family Medicine | Admitting: Family Medicine

## 2023-09-02 DIAGNOSIS — Z1231 Encounter for screening mammogram for malignant neoplasm of breast: Secondary | ICD-10-CM

## 2023-09-26 DIAGNOSIS — J069 Acute upper respiratory infection, unspecified: Secondary | ICD-10-CM | POA: Diagnosis not present

## 2023-09-26 DIAGNOSIS — Z6830 Body mass index (BMI) 30.0-30.9, adult: Secondary | ICD-10-CM | POA: Diagnosis not present

## 2023-09-26 DIAGNOSIS — B349 Viral infection, unspecified: Secondary | ICD-10-CM | POA: Diagnosis not present

## 2023-09-26 DIAGNOSIS — R059 Cough, unspecified: Secondary | ICD-10-CM | POA: Diagnosis not present

## 2023-09-26 DIAGNOSIS — R509 Fever, unspecified: Secondary | ICD-10-CM | POA: Diagnosis not present

## 2023-09-26 DIAGNOSIS — R058 Other specified cough: Secondary | ICD-10-CM | POA: Diagnosis not present

## 2023-11-21 ENCOUNTER — Other Ambulatory Visit: Payer: Self-pay

## 2023-11-21 DIAGNOSIS — D500 Iron deficiency anemia secondary to blood loss (chronic): Secondary | ICD-10-CM

## 2023-11-24 ENCOUNTER — Inpatient Hospital Stay: Payer: Medicare Other | Attending: Hematology

## 2023-11-24 DIAGNOSIS — D500 Iron deficiency anemia secondary to blood loss (chronic): Secondary | ICD-10-CM

## 2023-11-24 DIAGNOSIS — D472 Monoclonal gammopathy: Secondary | ICD-10-CM | POA: Diagnosis not present

## 2023-11-24 DIAGNOSIS — D509 Iron deficiency anemia, unspecified: Secondary | ICD-10-CM | POA: Insufficient documentation

## 2023-11-24 LAB — CMP (CANCER CENTER ONLY)
ALT: 15 U/L (ref 0–44)
AST: 19 U/L (ref 15–41)
Albumin: 3.9 g/dL (ref 3.5–5.0)
Alkaline Phosphatase: 71 U/L (ref 38–126)
Anion gap: 7 (ref 5–15)
BUN: 26 mg/dL — ABNORMAL HIGH (ref 8–23)
CO2: 29 mmol/L (ref 22–32)
Calcium: 9.3 mg/dL (ref 8.9–10.3)
Chloride: 104 mmol/L (ref 98–111)
Creatinine: 1.28 mg/dL — ABNORMAL HIGH (ref 0.44–1.00)
GFR, Estimated: 44 mL/min — ABNORMAL LOW (ref >60)
Glucose, Bld: 100 mg/dL — ABNORMAL HIGH (ref 70–99)
Potassium: 4.2 mmol/L (ref 3.5–5.1)
Sodium: 140 mmol/L (ref 135–145)
Total Bilirubin: 0.5 mg/dL (ref 0.0–1.2)
Total Protein: 7.6 g/dL (ref 6.5–8.1)

## 2023-11-24 LAB — CBC WITH DIFFERENTIAL (CANCER CENTER ONLY)
Abs Immature Granulocytes: 0.03 10*3/uL (ref 0.00–0.07)
Basophils Absolute: 0.1 10*3/uL (ref 0.0–0.1)
Basophils Relative: 1 %
Eosinophils Absolute: 0.2 10*3/uL (ref 0.0–0.5)
Eosinophils Relative: 3 %
HCT: 37.8 % (ref 36.0–46.0)
Hemoglobin: 12 g/dL (ref 12.0–15.0)
Immature Granulocytes: 0 %
Lymphocytes Relative: 18 %
Lymphs Abs: 1.5 10*3/uL (ref 0.7–4.0)
MCH: 30.1 pg (ref 26.0–34.0)
MCHC: 31.7 g/dL (ref 30.0–36.0)
MCV: 94.7 fL (ref 80.0–100.0)
Monocytes Absolute: 0.5 10*3/uL (ref 0.1–1.0)
Monocytes Relative: 6 %
Neutro Abs: 5.9 10*3/uL (ref 1.7–7.7)
Neutrophils Relative %: 72 %
Platelet Count: 300 10*3/uL (ref 150–400)
RBC: 3.99 MIL/uL (ref 3.87–5.11)
RDW: 13.7 % (ref 11.5–15.5)
WBC Count: 8.3 10*3/uL (ref 4.0–10.5)
nRBC: 0 % (ref 0.0–0.2)

## 2023-11-24 LAB — IRON AND IRON BINDING CAPACITY (CC-WL,HP ONLY)
Iron: 135 ug/dL (ref 28–170)
Saturation Ratios: 43 % — ABNORMAL HIGH (ref 10.4–31.8)
TIBC: 312 ug/dL (ref 250–450)
UIBC: 177 ug/dL (ref 148–442)

## 2023-11-24 LAB — FERRITIN: Ferritin: 205 ng/mL (ref 11–307)

## 2023-12-12 DIAGNOSIS — Z23 Encounter for immunization: Secondary | ICD-10-CM | POA: Diagnosis not present

## 2024-01-08 DIAGNOSIS — R059 Cough, unspecified: Secondary | ICD-10-CM | POA: Diagnosis not present

## 2024-01-08 DIAGNOSIS — J069 Acute upper respiratory infection, unspecified: Secondary | ICD-10-CM | POA: Diagnosis not present

## 2024-01-08 DIAGNOSIS — Z6830 Body mass index (BMI) 30.0-30.9, adult: Secondary | ICD-10-CM | POA: Diagnosis not present

## 2024-01-19 DIAGNOSIS — J069 Acute upper respiratory infection, unspecified: Secondary | ICD-10-CM | POA: Diagnosis not present

## 2024-01-19 DIAGNOSIS — R059 Cough, unspecified: Secondary | ICD-10-CM | POA: Diagnosis not present

## 2024-04-09 ENCOUNTER — Other Ambulatory Visit: Payer: Self-pay

## 2024-04-09 ENCOUNTER — Telehealth: Payer: Self-pay | Admitting: Hematology

## 2024-04-09 NOTE — Telephone Encounter (Signed)
 Rescheule appointment per provider pal.  Called left VM with changes made to the upcoming appointment.

## 2024-06-04 DIAGNOSIS — Z23 Encounter for immunization: Secondary | ICD-10-CM | POA: Diagnosis not present

## 2024-06-07 ENCOUNTER — Inpatient Hospital Stay: Payer: Medicare Other

## 2024-06-10 ENCOUNTER — Inpatient Hospital Stay: Admitting: Hematology

## 2024-06-14 ENCOUNTER — Inpatient Hospital Stay: Payer: Medicare Other | Admitting: Hematology

## 2024-06-18 ENCOUNTER — Other Ambulatory Visit: Payer: Self-pay

## 2024-06-18 DIAGNOSIS — D472 Monoclonal gammopathy: Secondary | ICD-10-CM

## 2024-06-18 DIAGNOSIS — D500 Iron deficiency anemia secondary to blood loss (chronic): Secondary | ICD-10-CM

## 2024-06-18 DIAGNOSIS — D649 Anemia, unspecified: Secondary | ICD-10-CM

## 2024-06-18 DIAGNOSIS — D509 Iron deficiency anemia, unspecified: Secondary | ICD-10-CM

## 2024-06-21 ENCOUNTER — Inpatient Hospital Stay: Attending: Hematology

## 2024-06-21 DIAGNOSIS — N183 Chronic kidney disease, stage 3 unspecified: Secondary | ICD-10-CM | POA: Insufficient documentation

## 2024-06-21 DIAGNOSIS — D509 Iron deficiency anemia, unspecified: Secondary | ICD-10-CM | POA: Insufficient documentation

## 2024-06-21 DIAGNOSIS — D649 Anemia, unspecified: Secondary | ICD-10-CM

## 2024-06-21 DIAGNOSIS — D472 Monoclonal gammopathy: Secondary | ICD-10-CM | POA: Diagnosis not present

## 2024-06-21 DIAGNOSIS — R062 Wheezing: Secondary | ICD-10-CM | POA: Insufficient documentation

## 2024-06-21 DIAGNOSIS — D500 Iron deficiency anemia secondary to blood loss (chronic): Secondary | ICD-10-CM

## 2024-06-21 DIAGNOSIS — R059 Cough, unspecified: Secondary | ICD-10-CM | POA: Diagnosis not present

## 2024-06-21 LAB — CMP (CANCER CENTER ONLY)
ALT: 16 U/L (ref 0–44)
AST: 20 U/L (ref 15–41)
Albumin: 3.9 g/dL (ref 3.5–5.0)
Alkaline Phosphatase: 76 U/L (ref 38–126)
Anion gap: 6 (ref 5–15)
BUN: 33 mg/dL — ABNORMAL HIGH (ref 8–23)
CO2: 26 mmol/L (ref 22–32)
Calcium: 9 mg/dL (ref 8.9–10.3)
Chloride: 108 mmol/L (ref 98–111)
Creatinine: 1.43 mg/dL — ABNORMAL HIGH (ref 0.44–1.00)
GFR, Estimated: 38 mL/min — ABNORMAL LOW (ref >60)
Glucose, Bld: 101 mg/dL — ABNORMAL HIGH (ref 70–99)
Potassium: 4.3 mmol/L (ref 3.5–5.1)
Sodium: 140 mmol/L (ref 135–145)
Total Bilirubin: 0.5 mg/dL (ref 0.0–1.2)
Total Protein: 7.4 g/dL (ref 6.5–8.1)

## 2024-06-21 LAB — CBC WITH DIFFERENTIAL (CANCER CENTER ONLY)
Abs Immature Granulocytes: 0.01 K/uL (ref 0.00–0.07)
Basophils Absolute: 0.1 K/uL (ref 0.0–0.1)
Basophils Relative: 1 %
Eosinophils Absolute: 0.3 K/uL (ref 0.0–0.5)
Eosinophils Relative: 6 %
HCT: 40 % (ref 36.0–46.0)
Hemoglobin: 13.3 g/dL (ref 12.0–15.0)
Immature Granulocytes: 0 %
Lymphocytes Relative: 22 %
Lymphs Abs: 1.3 K/uL (ref 0.7–4.0)
MCH: 30.2 pg (ref 26.0–34.0)
MCHC: 33.3 g/dL (ref 30.0–36.0)
MCV: 90.7 fL (ref 80.0–100.0)
Monocytes Absolute: 0.6 K/uL (ref 0.1–1.0)
Monocytes Relative: 10 %
Neutro Abs: 3.6 K/uL (ref 1.7–7.7)
Neutrophils Relative %: 61 %
Platelet Count: 232 K/uL (ref 150–400)
RBC: 4.41 MIL/uL (ref 3.87–5.11)
RDW: 14.6 % (ref 11.5–15.5)
WBC Count: 5.9 K/uL (ref 4.0–10.5)
nRBC: 0 % (ref 0.0–0.2)

## 2024-06-21 LAB — IRON AND IRON BINDING CAPACITY (CC-WL,HP ONLY)
Iron: 107 ug/dL (ref 28–170)
Saturation Ratios: 34 % — ABNORMAL HIGH (ref 10.4–31.8)
TIBC: 312 ug/dL (ref 250–450)
UIBC: 205 ug/dL (ref 148–442)

## 2024-06-21 LAB — FERRITIN: Ferritin: 226 ng/mL (ref 11–307)

## 2024-06-22 LAB — KAPPA/LAMBDA LIGHT CHAINS
Kappa free light chain: 63.5 mg/L — ABNORMAL HIGH (ref 3.3–19.4)
Kappa, lambda light chain ratio: 2.03 — ABNORMAL HIGH (ref 0.26–1.65)
Lambda free light chains: 31.3 mg/L — ABNORMAL HIGH (ref 5.7–26.3)

## 2024-06-24 LAB — MULTIPLE MYELOMA PANEL, SERUM
Albumin SerPl Elph-Mcnc: 3.1 g/dL (ref 2.9–4.4)
Albumin/Glob SerPl: 0.9 (ref 0.7–1.7)
Alpha 1: 0.2 g/dL (ref 0.0–0.4)
Alpha2 Glob SerPl Elph-Mcnc: 0.8 g/dL (ref 0.4–1.0)
B-Globulin SerPl Elph-Mcnc: 1.5 g/dL — ABNORMAL HIGH (ref 0.7–1.3)
Gamma Glob SerPl Elph-Mcnc: 1 g/dL (ref 0.4–1.8)
Globulin, Total: 3.6 g/dL (ref 2.2–3.9)
IgA: 366 mg/dL (ref 64–422)
IgG (Immunoglobin G), Serum: 1494 mg/dL (ref 586–1602)
IgM (Immunoglobulin M), Srm: 108 mg/dL (ref 26–217)
Total Protein ELP: 6.7 g/dL (ref 6.0–8.5)

## 2024-06-26 DIAGNOSIS — Z23 Encounter for immunization: Secondary | ICD-10-CM | POA: Diagnosis not present

## 2024-06-27 NOTE — Assessment & Plan Note (Signed)
-  During lab work up for her anemia, 08/11/20 SPEP showed positive M protein at the low level, 0.6g/dl, elevated kappa and lambda light chain level, which is nonspecific.  -She underwent Bone marrow biopsy on 09/01/20. Pathology was overall negative, but did show 5% plasmacytosis. Cytogenetics was normal -Her M protein is now undetectable, and her light chain level is also spontaneously improving (last 43.9 on 02/06/22). We discussed that this is probably non-specific inflammatory related, I think her risk of multiple myeloma is very low -continue monitoring

## 2024-06-27 NOTE — Assessment & Plan Note (Signed)
She previously had iron deficient anemia, and resolved after iron supplement in 2020 -She had a recurrent anemia in August 2021 after upper respiratory infection and antibiotics. Endo/colonoscopy by Dr. Dulce Sellar was negative. -She was treated with 4 doses of IV Venofer in 06/2020 and responded well but anemia did not resolve. -07/2020 Labs showed low TIBC which supports anemia of chronic disease. Other lab work was negative for other nutritional anemia. No lab evidence of hemolysis. Her anemia subsequently resolved. -continue iv iron as needed. She received last iron infusion in 09/2022 for anemia.

## 2024-06-28 ENCOUNTER — Inpatient Hospital Stay (HOSPITAL_BASED_OUTPATIENT_CLINIC_OR_DEPARTMENT_OTHER): Admitting: Hematology

## 2024-06-28 VITALS — BP 138/88 | HR 76 | Temp 97.8°F | Resp 17 | Wt 190.7 lb

## 2024-06-28 DIAGNOSIS — R062 Wheezing: Secondary | ICD-10-CM | POA: Diagnosis not present

## 2024-06-28 DIAGNOSIS — D500 Iron deficiency anemia secondary to blood loss (chronic): Secondary | ICD-10-CM | POA: Diagnosis not present

## 2024-06-28 DIAGNOSIS — D472 Monoclonal gammopathy: Secondary | ICD-10-CM | POA: Diagnosis not present

## 2024-06-28 DIAGNOSIS — N183 Chronic kidney disease, stage 3 unspecified: Secondary | ICD-10-CM | POA: Diagnosis not present

## 2024-06-28 DIAGNOSIS — D509 Iron deficiency anemia, unspecified: Secondary | ICD-10-CM | POA: Diagnosis not present

## 2024-06-28 DIAGNOSIS — R059 Cough, unspecified: Secondary | ICD-10-CM | POA: Diagnosis not present

## 2024-06-28 NOTE — Progress Notes (Signed)
 [facility]   Telephone:(336) [phone] Fax:(336) [phone]   Clinic Follow up Note   Patient Care Team: Emi Shuck, FNP as PCP - General (Family Medicine) Burton, Lacie K, NP as Nurse Practitioner (Nurse Practitioner) Lanny Callander, MD as Consulting Physician (Hematology) Burnette Fallow, MD as Consulting Physician (Gastroenterology)  Date of Service:  06/28/2024  CHIEF COMPLAINT: f/u of MGUS   CURRENT THERAPY:  Surveillance  Oncology History   Iron  deficiency anemia, unspecified  She previously had iron  deficient anemia, and resolved after iron  supplement in 2020 -She had a recurrent anemia in August 2021 after upper respiratory infection and antibiotics. Endo/colonoscopy by Dr. Burnette was negative. -She was treated with 4 doses of IV Venofer  in 06/2020 and responded well but anemia did not resolve. -07/2020 Labs showed low TIBC which supports anemia of chronic disease. Other lab work was negative for other nutritional anemia. No lab evidence of hemolysis. Her anemia subsequently resolved. -continue iv iron  as needed. She received last iron  infusion in 09/2022 for anemia.   MGUS (monoclonal gammopathy of unknown significance) -During lab work up for her anemia, 08/11/20 SPEP showed positive M protein at the low level, 0.6g/dl, elevated kappa and lambda light chain level, which is nonspecific.  -She underwent Bone marrow biopsy on 09/01/20. Pathology was overall negative, but did show 5% plasmacytosis. Cytogenetics was normal -Her M protein is now undetectable, and her light chain level is also spontaneously improving (last 43.9 on 02/06/22). We discussed that this is probably non-specific inflammatory related, I think her risk of multiple myeloma is very low -continue monitoring   Assessment & Plan Monoclonal gammopathy Monoclonal gammopathy is stable. M protein is undetectable, and light chain levels are slightly elevated but stable. - Continue current management and  monitoring. - Schedule follow-up in June with lab work a week prior.  Iron  deficiency anemia Iron  deficiency anemia is well-managed. Hemoglobin levels are stable, and iron  levels are within normal range. Ferritin is slightly elevated, indicating adequate iron  stores. - Discontinue or reduce iron  supplementation. - Re-evaluate iron  levels at next follow-up in June.  Chronic kidney disease stage 3 Chronic kidney disease stage 3 is well-managed. Kidney function is below 60 but consistent with stage 3. - Encourage increased water intake. - Follow up with primary care physician annually.  Chronic wheezing and cough, likely related to seasonal allergic rhinitis Chronic wheezing and cough are likely due to seasonal allergic rhinitis. Symptoms are intermittent and not severe. Antihistamines provide some relief, indicating an allergic component. - Consider over-the-counter antihistamines like Claritin . - If symptoms worsen, consider referral to a pulmonologist or allergy specialist.  Plan - Lab reviewed, M protein negative, light chain slightly elevated, overall stable - Exam was unremarkable, I recommend her to follow-up with her PCP for her intermittent wheezing - Lab and follow-up in 6 months, so it would be 6 months apart from her PCP's visit.  Changed to annual visit after next 1    Discussed the use of AI scribe software for clinical note transcription with the patient, who gave verbal consent to proceed.  History of Present Illness Rebecca Chen is a 78 year old female who presents for follow-up.  She experiences seasonal shortness of breath and wheezing for about a year, which are mild and do not significantly limit her activities. Coughing, particularly when lying down, sometimes triggers the wheezing. Antihistamines sometimes help. Allergy testing showed sensitivity to oat pollen and wool fibers, but she avoids these allergens. No smoking.  She has  stage three chronic  kidney disease with a glomerular filtration rate below sixty. Recent lab results show a slightly elevated light chain level, but normal hemoglobin, iron  level, protein, kidney, and liver functions. Her M protein was not detectable in the latest tests.  She is currently taking an iron  supplement, having reduced her dose from 120 mg to 60 mg. She also takes omeprazole, cholesterol medication, vitamin D, Ambien as needed, vitamin C , and calcium. Her blood pressure is stable, and she is not diabetic. She has undergone heart testing in the past, which was normal, and she does not have asthma. Inhalers do not alleviate her wheezing.     All other systems were reviewed with the patient and are negative.  MEDICAL HISTORY:  Past Medical History:  Diagnosis Date   AKI (acute kidney injury) 01/01/2019   Anemia    Hyperlipidemia    Hypertension    Migraines    Monoclonal gammopathy     SURGICAL HISTORY: Past Surgical History:  Procedure Laterality Date   FOOT SURGERY     IR IVC FILTER PLMT / S&I /IMG GUID/MOD SED  01/01/2019   IR IVC FILTER RETRIEVAL / S&I /IMG GUID/MOD SED  08/09/2019   IR RADIOLOGIST EVAL & MGMT  07/21/2019    I have reviewed the social history and family history with the patient and they are unchanged from previous note.  ALLERGIES:  is allergic to mixed vespid venom and bee venom.  MEDICATIONS:  Current Outpatient Medications  Medication Sig Dispense Refill   Ascorbic Acid  (VITAMIN C  PO) Take 1 tablet by mouth daily.      atenolol  (TENORMIN ) 100 MG tablet Take 100 mg by mouth daily.     CALCIUM PO Take 1 tablet by mouth daily.      EPINEPHrine  0.3 mg/0.3 mL IJ SOAJ injection Inject 0.3 mg into the muscle as needed.     FEROSUL 325 (65 Fe) MG tablet TAKE 1 TABLET BY MOUTH ONCE OR TWICE DAILY, WITH VITAMIN-C 60 tablet 1   omeprazole (PRILOSEC) 40 MG capsule Take 40 mg by mouth daily.     simvastatin  (ZOCOR ) 40 MG tablet Take 40 mg by mouth every evening.      VITAMIN D  PO Take 1 tablet by mouth daily.     zolpidem (AMBIEN) 10 MG tablet Take 10 mg by mouth at bedtime as needed. Take for jet lag     No current facility-administered medications for this visit.   Facility-Administered Medications Ordered in Other Visits  Medication Dose Route Frequency Provider Last Rate Last Admin   loratadine  (CLARITIN ) tablet 10 mg  10 mg Oral Daily Ennever, Maude SAUNDERS, MD        PHYSICAL EXAMINATION: ECOG PERFORMANCE STATUS: 1 - Symptomatic but completely ambulatory  Vitals:   06/28/24 0755  BP: 138/88  Pulse: 76  Resp: 17  Temp: 97.8 F (36.6 C)  SpO2: 99%   Wt Readings from Last 3 Encounters:  06/28/24 190 lb 11.2 oz (86.5 kg)  06/16/23 190 lb 9.6 oz (86.5 kg)  12/16/22 193 lb 12.8 oz (87.9 kg)     GENERAL:alert, no distress and comfortable SKIN: skin color, texture, turgor are normal, no rashes or significant lesions EYES: normal, Conjunctiva are pink and non-injected, sclera clear NECK: supple, thyroid  normal size, non-tender, without nodularity LYMPH:  no palpable lymphadenopathy in the cervical, axillary  LUNGS: clear to auscultation and percussion with normal breathing effort HEART: regular rate & rhythm and no murmurs and no lower extremity  edema ABDOMEN:abdomen soft, non-tender and normal bowel sounds Musculoskeletal:no cyanosis of digits and no clubbing  NEURO: alert & oriented x 3 with fluent speech, no focal motor/sensory deficits  Physical Exam CHEST: No wheezing on auscultation.  LABORATORY DATA:  I have reviewed the data as listed    Latest Ref Rng & Units 06/21/2024    7:55 AM 11/24/2023    7:50 AM 06/09/2023    8:03 AM  CBC  WBC 4.0 - 10.5 K/uL 5.9  8.3  5.9   Hemoglobin 12.0 - 15.0 g/dL 86.6  87.9  86.1   Hematocrit 36.0 - 46.0 % 40.0  37.8  41.9   Platelets 150 - 400 K/uL 232  300  215         Latest Ref Rng & Units 06/21/2024    7:55 AM 11/24/2023    7:50 AM 06/09/2023    8:03 AM  CMP  Glucose 70 - 99 mg/dL 898  899  852    BUN 8 - 23 mg/dL 33  26  21   Creatinine 0.44 - 1.00 mg/dL 8.56  8.71  8.88   Sodium 135 - 145 mmol/L 140  140  140   Potassium 3.5 - 5.1 mmol/L 4.3  4.2  4.0   Chloride 98 - 111 mmol/L 108  104  105   CO2 22 - 32 mmol/L 26  29  29    Calcium 8.9 - 10.3 mg/dL 9.0  9.3  9.0   Total Protein 6.5 - 8.1 g/dL 7.4  7.6  7.2   Total Bilirubin 0.0 - 1.2 mg/dL 0.5  0.5  0.7   Alkaline Phos 38 - 126 U/L 76  71  71   AST 15 - 41 U/L 20  19  22    ALT 0 - 44 U/L 16  15  25        RADIOGRAPHIC STUDIES: I have personally reviewed the radiological images as listed and agreed with the findings in the report. No results found.    No orders of the defined types were placed in this encounter.  All questions were answered. The patient knows to call the clinic with any problems, questions or concerns. No barriers to learning was detected. The total time spent in the appointment was 25 minutes, including review of chart and various tests results, discussions about plan of care and coordination of care plan     Onita Mattock, MD 06/28/2024

## 2024-06-30 DIAGNOSIS — Z411 Encounter for cosmetic surgery: Secondary | ICD-10-CM | POA: Diagnosis not present

## 2024-06-30 DIAGNOSIS — Z85828 Personal history of other malignant neoplasm of skin: Secondary | ICD-10-CM | POA: Diagnosis not present

## 2024-06-30 DIAGNOSIS — L578 Other skin changes due to chronic exposure to nonionizing radiation: Secondary | ICD-10-CM | POA: Diagnosis not present

## 2024-06-30 DIAGNOSIS — D225 Melanocytic nevi of trunk: Secondary | ICD-10-CM | POA: Diagnosis not present

## 2024-06-30 DIAGNOSIS — L814 Other melanin hyperpigmentation: Secondary | ICD-10-CM | POA: Diagnosis not present

## 2024-06-30 DIAGNOSIS — L821 Other seborrheic keratosis: Secondary | ICD-10-CM | POA: Diagnosis not present

## 2024-07-23 ENCOUNTER — Other Ambulatory Visit: Payer: Self-pay | Admitting: Family Medicine

## 2024-07-23 DIAGNOSIS — Z1231 Encounter for screening mammogram for malignant neoplasm of breast: Secondary | ICD-10-CM

## 2024-08-06 DIAGNOSIS — D509 Iron deficiency anemia, unspecified: Secondary | ICD-10-CM | POA: Diagnosis not present

## 2024-08-06 DIAGNOSIS — J4530 Mild persistent asthma, uncomplicated: Secondary | ICD-10-CM | POA: Diagnosis not present

## 2024-08-06 DIAGNOSIS — Z6830 Body mass index (BMI) 30.0-30.9, adult: Secondary | ICD-10-CM | POA: Diagnosis not present

## 2024-08-06 DIAGNOSIS — Z0000 Encounter for general adult medical examination without abnormal findings: Secondary | ICD-10-CM | POA: Diagnosis not present

## 2024-08-06 DIAGNOSIS — Z862 Personal history of diseases of the blood and blood-forming organs and certain disorders involving the immune mechanism: Secondary | ICD-10-CM | POA: Diagnosis not present

## 2024-08-06 DIAGNOSIS — H35033 Hypertensive retinopathy, bilateral: Secondary | ICD-10-CM | POA: Diagnosis not present

## 2024-08-06 DIAGNOSIS — E559 Vitamin D deficiency, unspecified: Secondary | ICD-10-CM | POA: Diagnosis not present

## 2024-08-06 DIAGNOSIS — E785 Hyperlipidemia, unspecified: Secondary | ICD-10-CM | POA: Diagnosis not present

## 2024-08-06 DIAGNOSIS — R062 Wheezing: Secondary | ICD-10-CM | POA: Diagnosis not present

## 2024-08-06 DIAGNOSIS — G4709 Other insomnia: Secondary | ICD-10-CM | POA: Diagnosis not present

## 2024-08-06 DIAGNOSIS — I10 Essential (primary) hypertension: Secondary | ICD-10-CM | POA: Diagnosis not present

## 2024-09-03 ENCOUNTER — Encounter: Payer: Self-pay | Admitting: Hematology

## 2024-09-03 ENCOUNTER — Ambulatory Visit
Admission: RE | Admit: 2024-09-03 | Discharge: 2024-09-03 | Disposition: A | Source: Ambulatory Visit | Attending: Family Medicine | Admitting: Family Medicine

## 2024-09-03 DIAGNOSIS — Z1231 Encounter for screening mammogram for malignant neoplasm of breast: Secondary | ICD-10-CM

## 2024-09-27 ENCOUNTER — Other Ambulatory Visit: Payer: Self-pay

## 2024-09-27 ENCOUNTER — Emergency Department (HOSPITAL_BASED_OUTPATIENT_CLINIC_OR_DEPARTMENT_OTHER): Admitting: Radiology

## 2024-09-27 ENCOUNTER — Encounter (HOSPITAL_BASED_OUTPATIENT_CLINIC_OR_DEPARTMENT_OTHER): Payer: Self-pay

## 2024-09-27 ENCOUNTER — Emergency Department (HOSPITAL_BASED_OUTPATIENT_CLINIC_OR_DEPARTMENT_OTHER)
Admission: EM | Admit: 2024-09-27 | Discharge: 2024-09-27 | Disposition: A | Discharge status: Home / Self Care | Attending: Emergency Medicine | Admitting: Emergency Medicine

## 2024-09-27 DIAGNOSIS — R0602 Shortness of breath: Secondary | ICD-10-CM | POA: Diagnosis present

## 2024-09-27 DIAGNOSIS — B349 Viral infection, unspecified: Secondary | ICD-10-CM | POA: Diagnosis not present

## 2024-09-27 DIAGNOSIS — J45901 Unspecified asthma with (acute) exacerbation: Secondary | ICD-10-CM | POA: Insufficient documentation

## 2024-09-27 HISTORY — DX: Unspecified asthma, uncomplicated: J45.909

## 2024-09-27 LAB — COMPREHENSIVE METABOLIC PANEL WITH GFR
ALT: 21 U/L (ref 0–44)
AST: 25 U/L (ref 15–41)
Albumin: 4 g/dL (ref 3.5–5.0)
Alkaline Phosphatase: 87 U/L (ref 38–126)
Anion gap: 10 (ref 5–15)
BUN: 21 mg/dL (ref 8–23)
CO2: 27 mmol/L (ref 22–32)
Calcium: 10.1 mg/dL (ref 8.9–10.3)
Chloride: 103 mmol/L (ref 98–111)
Creatinine, Ser: 1.11 mg/dL — ABNORMAL HIGH (ref 0.44–1.00)
GFR, Estimated: 51 mL/min — ABNORMAL LOW
Glucose, Bld: 119 mg/dL — ABNORMAL HIGH (ref 70–99)
Potassium: 4.2 mmol/L (ref 3.5–5.1)
Sodium: 139 mmol/L (ref 135–145)
Total Bilirubin: 0.6 mg/dL (ref 0.0–1.2)
Total Protein: 7.3 g/dL (ref 6.5–8.1)

## 2024-09-27 LAB — CBC WITH DIFFERENTIAL/PLATELET
Abs Immature Granulocytes: 0.02 K/uL (ref 0.00–0.07)
Basophils Absolute: 0.1 K/uL (ref 0.0–0.1)
Basophils Relative: 1 %
Eosinophils Absolute: 0.4 K/uL (ref 0.0–0.5)
Eosinophils Relative: 5 %
HCT: 40.4 % (ref 36.0–46.0)
Hemoglobin: 13.2 g/dL (ref 12.0–15.0)
Immature Granulocytes: 0 %
Lymphocytes Relative: 18 %
Lymphs Abs: 1.6 K/uL (ref 0.7–4.0)
MCH: 30.6 pg (ref 26.0–34.0)
MCHC: 32.7 g/dL (ref 30.0–36.0)
MCV: 93.7 fL (ref 80.0–100.0)
Monocytes Absolute: 0.8 K/uL (ref 0.1–1.0)
Monocytes Relative: 9 %
Neutro Abs: 5.8 K/uL (ref 1.7–7.7)
Neutrophils Relative %: 67 %
Platelets: 237 K/uL (ref 150–400)
RBC: 4.31 MIL/uL (ref 3.87–5.11)
RDW: 13.3 % (ref 11.5–15.5)
WBC: 8.6 K/uL (ref 4.0–10.5)
nRBC: 0 % (ref 0.0–0.2)

## 2024-09-27 MED ORDER — BENZONATATE 100 MG PO CAPS
100.0000 mg | ORAL_CAPSULE | Freq: Once | ORAL | Status: AC
  Administered 2024-09-27: 100 mg via ORAL
  Filled 2024-09-27: qty 1

## 2024-09-27 MED ORDER — OSELTAMIVIR PHOSPHATE 75 MG PO CAPS: 75.0000 mg | ORAL_CAPSULE | Freq: Two times a day (BID) | ORAL | 0 refills | Status: AC

## 2024-09-27 MED ORDER — METHYLPREDNISOLONE SODIUM SUCC 125 MG IJ SOLR
125.0000 mg | Freq: Once | INTRAMUSCULAR | Status: AC
  Administered 2024-09-27: 125 mg via INTRAVENOUS
  Filled 2024-09-27: qty 2

## 2024-09-27 MED ORDER — IPRATROPIUM-ALBUTEROL 0.5-2.5 (3) MG/3ML IN SOLN
3.0000 mL | Freq: Once | RESPIRATORY_TRACT | Status: AC
  Administered 2024-09-27: 3 mL via RESPIRATORY_TRACT
  Filled 2024-09-27: qty 3

## 2024-09-27 MED ORDER — PREDNISONE 10 MG PO TABS: 20.0000 mg | ORAL_TABLET | Freq: Every day | ORAL | 0 refills | Status: AC

## 2024-09-27 MED ORDER — BENZONATATE 100 MG PO CAPS: 100.0000 mg | ORAL_CAPSULE | Freq: Three times a day (TID) | ORAL | 0 refills | Status: AC | PRN

## 2024-09-27 MED ORDER — ONDANSETRON 4 MG PO TBDP: 4.0000 mg | ORAL_TABLET | Freq: Three times a day (TID) | ORAL | 0 refills | Status: AC | PRN

## 2024-09-27 MED ORDER — OSELTAMIVIR PHOSPHATE 75 MG PO CAPS
75.0000 mg | ORAL_CAPSULE | Freq: Once | ORAL | Status: AC
  Administered 2024-09-27: 75 mg via ORAL
  Filled 2024-09-27: qty 1

## 2024-09-27 MED ORDER — ONDANSETRON HCL 4 MG/2ML IJ SOLN
4.0000 mg | Freq: Once | INTRAMUSCULAR | Status: AC
  Administered 2024-09-27: 4 mg via INTRAVENOUS
  Filled 2024-09-27: qty 2

## 2024-09-27 MED ORDER — ALBUTEROL SULFATE HFA 108 (90 BASE) MCG/ACT IN AERS
1.0000 | INHALATION_SPRAY | Freq: Once | RESPIRATORY_TRACT | Status: DC
  Filled 2024-09-27: qty 6.7

## 2024-09-27 NOTE — ED Triage Notes (Signed)
 Pt states that she has been up coughing all night and that her airway closed off a little while ago pt states that she is newly diagnosed with asthma. Productive cough for the past 3-4 days, tan colored mucous, denies fevers.

## 2024-09-27 NOTE — ED Provider Notes (Signed)
 " [place] [facility]ERGENCY DE[facility]MENT AT [facility] Provider Note   CSN: [id number] Arrival date & time: 09/27/24  9388     History Chief Complaint  Patient presents with   Shortness of Breath    HPI: Rebecca Chen is a 78 y.o. female with history pertinent for anemia who presents complaining of multiple symptoms. Patient arrived via POV.  History provided by patient.  No interpreter required during this encounter.  Patient presents to the emergency department for concerns for shortness of breath.  Reports that she developed multiple symptoms 4 days ago including cough, congestion, rhinorrhea, subjective fever and chills, no objective measurements at home) malaise, fatigue, myalgias.  Reports that initially cough was nonproductive, however she has had production of mucus over the past 24 hours.  Reports that she does have a history of asthma that was diagnosed this past fall.  Reports that she had significant difficulty breathing this morning.  Given patient is breathing normally on exam, asked what changed to help improve her breathing since she reports that she had significant difficulty breathing prior to arrival, patient clarified that the difficulty breathing is escalated to when she is actively having a coughing fit, and she feels like she is having difficulty catching her breath while coughing, however feels that she return to baseline when she is not actively coughing.  Denies chest pain, GI symptoms.  Patient's recorded medical, surgical, social, medication list and allergies were reviewed in the Snapshot window as part of the initial history.   Prior to Admission medications  Medication Sig Start Date End Date Taking? Authorizing Provider  benzonatate  (TESSALON ) 100 MG capsule Take 1 capsule (100 mg total) by mouth 3 (three) times daily as needed for cough. 09/27/24  Yes Rogelia Jerilynn RAMAN, MD  ondansetron  (ZOFRAN -ODT) 4 MG disintegrating tablet Take 1 tablet (4 mg total) by  mouth every 8 (eight) hours as needed for nausea or vomiting. 09/27/24  Yes Rogelia Jerilynn RAMAN, MD  oseltamivir  (TAMIFLU ) 75 MG capsule Take 1 capsule (75 mg total) by mouth every 12 (twelve) hours. 09/27/24  Yes Rogelia Jerilynn RAMAN, MD  predniSONE  (DELTASONE ) 10 MG tablet Take 2 tablets (20 mg total) by mouth daily for 4 days. 09/27/24 10/01/24 Yes Rogelia Jerilynn RAMAN, MD  Ascorbic Acid  (VITAMIN C  PO) Take 1 tablet by mouth daily.     [provider]  atenolol  (TENORMIN ) 100 MG tablet Take 100 mg by mouth daily.    [provider]  CALCIUM PO Take 1 tablet by mouth daily.     [provider]  EPINEPHrine  0.3 mg/0.3 mL IJ SOAJ injection Inject 0.3 mg into the muscle as needed.    [provider]  FEROSUL 325 (65 Fe) MG tablet TAKE 1 TABLET BY MOUTH ONCE OR TWICE DAILY, WITH VITAMIN-C 12/08/20   Lanny Callander, MD  omeprazole (PRILOSEC) 40 MG capsule Take 40 mg by mouth daily.    [provider]  simvastatin  (ZOCOR ) 40 MG tablet Take 40 mg by mouth every evening.     [provider]  VITAMIN D PO Take 1 tablet by mouth daily.    [provider]  zolpidem (AMBIEN) 10 MG tablet Take 10 mg by mouth at bedtime as needed. Take for jet lag    [provider]     Allergies: Mixed vespid venom and Bee venom   Review of Systems   ROS as per HPI  Physical Exam Updated Vital Signs BP 118/76   Pulse (!) 104  Temp 99.1 F (37.3 C) (Oral)   Resp 19   SpO2 92%  Physical Exam Vitals and nursing note reviewed.  Constitutional:      General: She is not in acute distress.    Appearance: She is well-developed.  HENT:     Head: Normocephalic and atraumatic.     Nose: Congestion and rhinorrhea present.  Eyes:     Conjunctiva/sclera: Conjunctivae normal.     Comments: Mild watery discharge from bilateral eyes  Cardiovascular:     Rate and Rhythm: Normal rate and regular rhythm.     Heart sounds: No murmur heard. Pulmonary:     Effort:  Pulmonary effort is normal. No tachypnea, accessory muscle usage or respiratory distress.     Breath sounds: Wheezing (Scattered) present. No decreased breath sounds or rhonchi.  Abdominal:     Palpations: Abdomen is soft.     Tenderness: There is no abdominal tenderness.  Musculoskeletal:        General: No swelling.     Cervical back: Neck supple.  Skin:    General: Skin is warm and dry.     Capillary Refill: Capillary refill takes less than 2 seconds.  Neurological:     Mental Status: She is alert.  Psychiatric:        Mood and Affect: Mood normal.     ED Course/ Medical Decision Making/ A&P    Procedures Procedures   Medications Ordered in ED Medications  albuterol  (VENTOLIN  HFA) 108 (90 Base) MCG/ACT inhaler 1-2 puff (has no administration in time range)  oseltamivir  (TAMIFLU ) capsule 75 mg (75 mg Oral Given 09/27/24 0739)  ipratropium-albuterol  (DUONEB) 0.5-2.5 (3) MG/3ML nebulizer solution 3 mL (3 mLs Nebulization Given 09/27/24 0725)    And  ipratropium-albuterol  (DUONEB) 0.5-2.5 (3) MG/3ML nebulizer solution 3 mL (3 mLs Nebulization Given 09/27/24 0724)    And  ipratropium-albuterol  (DUONEB) 0.5-2.5 (3) MG/3ML nebulizer solution 3 mL (3 mLs Nebulization Given 09/27/24 0724)  benzonatate  (TESSALON ) capsule 100 mg (100 mg Oral Given 09/27/24 0739)  methylPREDNISolone  sodium succinate (SOLU-MEDROL ) 125 mg/2 mL injection 125 mg (125 mg Intravenous Given 09/27/24 0739)  ondansetron  (ZOFRAN ) injection 4 mg (4 mg Intravenous Given 09/27/24 9147)    Medical Decision Making:   Rebecca Chen is a 78 y.o. female who presents for shortness of breath in the setting of multiple symptoms as per above.  Physical exam is pertinent for congestion, rhinorrhea, scattered wheezes, watery discharge from bilateral eyes.   The differential includes but is not limited to viral illness, asthma exacerbation, pneumonia, sepsis.  Independent historian: None  External data reviewed:  Labs: reviewed prior labs for baseline  Initial Plan:  Screening labs including CBC and Metabolic panel to evaluate for infectious or metabolic etiology of disease.  Chest x-ray to evaluate for structural/infectious intra-thoracic pathology.  EKG to evaluate for cardiac pathology Objective evaluation as below reviewed   Labs: Ordered, Independent interpretation, and Details: CBC without leukocytosis, anemia, thrombocytopenia. CMP with creatinine at baseline at 1.1.  No electrolyte derangement or emergent LFT abnormality.  Radiology: Ordered, Independent interpretation, Details: Chest x-ray without focal airspace opacification, cardiomediastinal silhouette gentian, pneumothorax, pleural effusion, bony derangement, and All images reviewed independently.  Agree with radiology report at this time.   DG Chest 2 View Result Date: 09/27/2024 EXAM: 2 VIEW(S) XRAY OF THE CHEST 09/27/2024 06:55:00 AM COMPARISON: 01/17/2022 CLINICAL HISTORY: eval for pneumonia FINDINGS: LUNGS AND PLEURA: No focal pulmonary opacity. No pleural effusion. No pneumothorax. HEART AND MEDIASTINUM: Calcified aorta. Hiatal  hernia. BONES AND SOFT TISSUES: Thoracic degenerative changes. No acute osseous abnormality. IMPRESSION: 1. No acute findings. Electronically signed by: Waddell Calk MD 09/27/2024 07:00 AM EST RP Workstation: [id number]   MM 3D SCREENING MAMMOGRAM BILATERAL BREAST Result Date: 09/11/2024 CLINICAL DATA:  Screening. EXAM: DIGITAL SCREENING BILATERAL MAMMOGRAM WITH TOMOSYNTHESIS AND CAD TECHNIQUE: Bilateral screening digital craniocaudal and mediolateral oblique mammograms were obtained. Bilateral screening digital breast tomosynthesis was performed. The images were evaluated with computer-aided detection. COMPARISON:  Previous exam(s). ACR Breast Density Category b: There are scattered areas of fibroglandular density. FINDINGS: There are no findings suspicious for malignancy. IMPRESSION: No mammographic evidence of  malignancy. A result letter of this screening mammogram will be mailed directly to the patient. RECOMMENDATION: Screening mammogram in one year. (Code:SM-B-01Y) BI-RADS CATEGORY  1: Negative. Electronically Signed   By: Toribio Agreste M.D.   On: 09/11/2024 11:42    EKG/Medicine tests: Ordered and Independent interpretation EKG Interpretation: Sinus rhythm Abnormal R-wave progression, early transition , seen on Apr-2020 No significant change since last tracing Confirmed by Rogelia Satterfield ([id number]) on 09/27/2024 7:18:48 AM                Interventions: DuoNebs, Tessalon , Solu-Medrol , Tamiflu , Zofran , albuterol  puffs  See the EMR for full details regarding lab and imaging results.  Patient overall well-appearing on exam, no respiratory distress, oxygenating well, nontachycardic, not afebrile.  Patient has not had any fevers prior to arrival.  Overall given multisystem symptoms, presentation is most consistent with viral illness.  Given, community prevalence, likelihood of influenza, given patient's advanced age, history of asthma, will treat empirically with Tamiflu  after discussion of risk and benefits with the patient.  Patient reports that she has respiratory distress while coughing, patient did have a brief coughing episode during my exam, and she does have increased work breathing specifically while coughing, however has rapid return to pre-COVID baseline which is reassuring.  Given she does have newly diagnosed asthma and scattered wheezes, will treat with Solu-Medrol  as well as DuoNebs.  Given cough is most distressing symptom for patient, will treat with Tessalon .  Labs and chest x-ray ordered, patient does not have pneumonia on chest x-ray, no leukocytosis, afebrile, thus feel that pneumonia is less likely than viral illness, will hold antibiotics at this time.  Metabolic panel reassuring, no evidence of dehydration.  On reevaluation after inventions with resolution of wheezing symptomatically much  better.  Patient did have development of mild tachycardia, which makes sense with patient received albuterol  treatments.  Overall given labs, x-ray reassuring, feel that patient is at low risk for sepsis, pneumonia, given she has reassuring pulmonary exam on reevaluation, feel that she is stable for discharge and outpatient management for asthma exacerbation in the setting of viral illness..  Presentation is most consistent with acute complicated illness, Current presentation is complicated by underlying chronic conditions, and I did consider and rule out acute life/limb-threatening illness  Discussion of management or test interpretations with external provider(s): Not indicated  Risk Drugs:Prescription drug management  Disposition: DISCHARGE: I believe that the patient is safe for discharge home with outpatient follow-up. Patient was informed of all pertinent physical exam, laboratory, and imaging findings. Patient's suspected etiology of their symptom presentation was discussed with the patient and all questions were answered. We discussed following up with PCP. I provided thorough ED return precautions. The patient feels safe and comfortable with this plan.  MDM generated using voice dictation software and may contain dictation errors.  Please contact me for any clarification  or with any questions.  Clinical Impression:  1. Viral illness   2. Exacerbation of asthma, unspecified asthma severity, unspecified whether persistent      Discharge   Final Clinical Impression(s) / ED Diagnoses Final diagnoses:  Viral illness  Exacerbation of asthma, unspecified asthma severity, unspecified whether persistent    Rx / DC Orders ED Discharge Orders          Ordered    benzonatate  (TESSALON ) 100 MG capsule  3 times daily PRN        09/27/24 0934    ondansetron  (ZOFRAN -ODT) 4 MG disintegrating tablet  Every 8 hours PRN        09/27/24 0934    oseltamivir  (TAMIFLU ) 75 MG capsule  Every 12  hours        09/27/24 0934    predniSONE  (DELTASONE ) 10 MG tablet  Daily        09/27/24 0934             Rogelia Jerilynn RAMAN, MD 09/27/24 0935  "

## 2024-09-27 NOTE — Discharge Instructions (Signed)
 Rebecca Chen  Thank you for allowing us  to take care of you today.  You came to the Emergency Department today because you were recently diagnosed with asthma this year, and you have had several days of multiple viral symptoms.  Here in the emergency department your vitals were normal.  We got a chest x-ray that does not show pneumonia or other abnormality.  Your blood work is also reassuring.  You most likely have the flu given this is very common.  Given you have history of asthma and you are older than 24, we are empirically treating you for flu with Tamiflu , this will help reduce the risk of developing severe illness.  You will take this twice a day for the next 5 days.  You initially had some mild wheezing when you came in, we gave you 3 breathing treatments and you are feeling better and your wheezing has resolved.  We are going to give you some albuterol  puffs from an inhaler.  You should take 4 puffs every 6 hours for the next 2 days, thereafter you can use it as needed for wheezing/shortness of breath/coughing.  We will also prescribe Tessalon  Per[place] which you can take as needed for coughing.  Otherwise use Tylenol , ibuprofen as needed for pain or fever, have plenty of fluids and rest.  To-Do: 1. Please follow-up with your primary doctor within 1 - 2 weeks / as soon as possible.    Please return to the Emergency Department or call 911 if you experience have worsening of your symptoms, or do not get better, chest pain, shortness of breath, severe or significantly worsening pain, high fever, severe confusion, pass out or have any reason to think that you need emergency medical care.   We hope you feel better soon.   Mitzie Later, MD Department of Emergency Medicine [facility]

## 2025-02-28 ENCOUNTER — Other Ambulatory Visit

## 2025-03-07 ENCOUNTER — Ambulatory Visit: Admitting: Hematology
